# Patient Record
Sex: Female | Born: 1985 | Race: White | Hispanic: No | Marital: Married | Smoking: Never smoker
Health system: Southern US, Community
[De-identification: ages and names within clinical notes are randomized; demographics above are authoritative.]

## PROBLEM LIST (undated history)

## (undated) DIAGNOSIS — K589 Irritable bowel syndrome without diarrhea: Secondary | ICD-10-CM

## (undated) DIAGNOSIS — R011 Cardiac murmur, unspecified: Secondary | ICD-10-CM

## (undated) HISTORY — DX: Irritable bowel syndrome, unspecified: K58.9

## (undated) HISTORY — DX: Cardiac murmur, unspecified: R01.1

## (undated) HISTORY — PX: WISDOM TOOTH EXTRACTION: SHX21

---

## 2011-10-29 ENCOUNTER — Emergency Department
Admission: EM | Admit: 2011-10-29 | Discharge: 2011-10-29 | Disposition: A | Discharge status: Home / Self Care | Payer: BC Managed Care – PPO | Source: Home / Self Care

## 2011-10-29 ENCOUNTER — Encounter: Payer: Self-pay | Admitting: *Deleted

## 2011-10-29 DIAGNOSIS — R300 Dysuria: Secondary | ICD-10-CM

## 2011-10-29 LAB — POCT URINALYSIS DIP (MANUAL ENTRY)
Ketones, POC UA: NEGATIVE
Protein Ur, POC: 30
Spec Grav, UA: 1.02 (ref 1.005–1.03)
Urobilinogen, UA: 0.2 (ref 0–1)
pH, UA: 7 (ref 5–8)

## 2011-10-29 MED ORDER — CEPHALEXIN 500 MG PO CAPS: 500.0000 mg | ORAL_CAPSULE | Freq: Three times a day (TID) | ORAL | Status: AC

## 2011-10-29 NOTE — ED Notes (Signed)
Patient c/o dysuria and "pink tinged" urine x this AM. No otc meds taken.

## 2011-10-29 NOTE — ED Provider Notes (Signed)
History     CSN: [id number]  Arrival date & time 10/29/11  1802   None     Chief Complaint  Patient presents with  . Dysuria   HPI DYSURIA Onset:  2 days  Description: dysuria, increased urinary frequency Modifying factors: none  Symptoms Urgency:  yes Frequency: yes  Hesitancy:  no Hematuria: mild  Flank Pain: no  Fever: no Nausea/Vomiting:  no Missed LMP: no STD exposure:  Discharge: no Irritants: no Rash: no  Red Flags   More than 3 UTI's last 12 months:  no PMH of  Diabetes or Immunosuppression:  no Renal Disease/Calculi: no Urinary Tract Abnormality:  no Instrumentation or Trauma: no   History reviewed. No pertinent past medical history.  History reviewed. No pertinent past surgical history.  Family History  Problem Relation Age of Onset  . Rheum arthritis Father   . Heart attack Father     History  Substance Use Topics  . Smoking status: Not on file  . Smokeless tobacco: Not on file  . Alcohol Use: Not on file    OB History    No data available      Review of Systems  All other systems reviewed and are negative.    Allergies  Review of patient's allergies indicates no known allergies.  Home Medications   Current Outpatient Rx  Name Route Sig Dispense Refill  . NORETHINDRON-ETHINYL ESTRAD-FE 1-20/1-30/1-35 MG-MCG PO TABS Oral Take 1 tablet by mouth daily.      BP 126/82  Pulse 95  Temp 98.9 F (37.2 C) (Oral)  Resp 14  Ht 5\' 4"  (1.626 m)  Wt 128 lb (58.06 kg)  BMI 21.97 kg/m2  SpO2 98%  LMP 10/21/2011  Physical Exam  Constitutional: She appears well-developed and well-nourished.  HENT:  Head: Normocephalic and atraumatic.  Eyes: Conjunctivae are normal. Pupils are equal, round, and reactive to light.  Neck: Normal range of motion. Neck supple.  Cardiovascular: Normal rate and regular rhythm.   Pulmonary/Chest: Effort normal and breath sounds normal.  Abdominal: Soft. Bowel sounds are normal. There is no tenderness.    Musculoskeletal: Normal range of motion.  Neurological: She is alert.  Skin: Skin is warm.    ED Course  Procedures (including critical care time)  Labs Reviewed - No data to display No results found.   1. Dysuria       MDM  Will treat with keflex  Urine culture   Discussed infectious and upper urinary tract red flags.  Follow up as needed.      The patient and/or caregiver has been counseled thoroughly with regard to treatment plan and/or medications prescribed including dosage, schedule, interactions, rationale for use, and possible side effects and they verbalize understanding. Diagnoses and expected course of recovery discussed and will return if not improved as expected or if the condition worsens. Patient and/or caregiver verbalized understanding.             Floydene Flock, MD 10/29/11 [phone]

## 2011-10-29 NOTE — ED Provider Notes (Signed)
Agree with exam, assessment, and plan.   Lattie Haw, MD 10/29/11 [phone]

## 2011-11-01 ENCOUNTER — Telehealth: Payer: Self-pay | Admitting: Family Medicine

## 2011-11-01 LAB — URINE CULTURE

## 2012-05-17 ENCOUNTER — Ambulatory Visit (INDEPENDENT_AMBULATORY_CARE_PROVIDER_SITE_OTHER): Payer: BC Managed Care – PPO | Admitting: Obstetrics & Gynecology

## 2012-05-17 ENCOUNTER — Encounter: Payer: Self-pay | Admitting: Obstetrics & Gynecology

## 2012-05-17 VITALS — BP 135/77 | HR 90 | Temp 98.5°F | Resp 16 | Ht 64.0 in | Wt 128.0 lb

## 2012-05-17 DIAGNOSIS — R51 Headache: Secondary | ICD-10-CM

## 2012-05-17 DIAGNOSIS — K5900 Constipation, unspecified: Secondary | ICD-10-CM

## 2012-05-17 DIAGNOSIS — Z01419 Encounter for gynecological examination (general) (routine) without abnormal findings: Secondary | ICD-10-CM

## 2012-05-18 DIAGNOSIS — K5900 Constipation, unspecified: Secondary | ICD-10-CM | POA: Insufficient documentation

## 2012-05-18 DIAGNOSIS — G8929 Other chronic pain: Secondary | ICD-10-CM | POA: Insufficient documentation

## 2012-05-18 NOTE — Progress Notes (Signed)
  Subjective:     Victoria Lin is a 27 y.o. female here for a routine exam.  Current complaints: mild constipation not responding to fiber gummies, desires preconceptual counseling  Personal health questionnaire reviewed: yes.   Gynecologic History Patient's last menstrual period was 05/08/2012. Contraception: condoms Last Pap: 2013. Results were: normal per pt Last mammogram: n/a. Results were: n/a  Obstetric History OB History   Grav Para Term Preterm Abortions TAB SAB Ect Mult Living   0 0 0 0 0 0 0 0 0 0        The following portions of the patient's history were reviewed and updated as appropriate: allergies, current medications, past family history, past medical history, past social history, past surgical history and problem list.  Review of Systems A comprehensive review of systems was negative.    Objective:   Filed Vitals:   05/17/12 1554  BP: 135/77  Pulse: 90  Temp: 98.5 F (36.9 C)  TempSrc: Oral  Resp: 16  Height: 5\' 4"  (1.626 m)  Weight: 128 lb (58.06 kg)     Vitals:  WNL General appearance: alert, cooperative and no distress Head: Normocephalic, without obvious abnormality, atraumatic Eyes: negative Throat: lips, mucosa, and tongue normal; teeth and gums normal Lungs: clear to auscultation bilaterally Breasts: normal appearance, no masses or tenderness, No nipple retraction or dimpling, No nipple discharge or bleeding Heart: regular rate and rhythm Abdomen: soft, non-tender; bowel sounds normal; no masses,  no organomegaly Pelvic: cervix normal in appearance, external genitalia normal, no adnexal masses or tenderness, no bladder tenderness, no cervical motion tenderness, perianal skin: no external genital warts noted, urethra without abnormality or discharge, uterus normal size, shape, and consistency and vagina normal without discharge Extremities: no edema, redness or tenderness in the calves or thighs Skin: no lesions or rash Lymph nodes: Axillary  adenopathy: none        Assessment:    Healthy female exam.    Plan:    Education reviewed: preconceptual counseling, . Follow up in: 1 year.  or sooner if becomes pregnant No pap today since low risk and had one last year. Miralax for constipation Offered CF testing Prental vitamins ordered Refer to [facility] for headaches.

## 2012-06-09 ENCOUNTER — Telehealth: Payer: Self-pay

## 2012-06-09 NOTE — Telephone Encounter (Signed)
L/M for patient to call office and schedule appt with [facility].

## 2013-01-13 ENCOUNTER — Ambulatory Visit (INDEPENDENT_AMBULATORY_CARE_PROVIDER_SITE_OTHER): Payer: Managed Care, Other (non HMO) | Admitting: Advanced Practice Midwife

## 2013-01-13 ENCOUNTER — Encounter: Payer: Self-pay | Admitting: Advanced Practice Midwife

## 2013-01-13 VITALS — BP 132/85 | Wt 129.0 lb

## 2013-01-13 DIAGNOSIS — Z3401 Encounter for supervision of normal first pregnancy, first trimester: Secondary | ICD-10-CM

## 2013-01-13 DIAGNOSIS — Z3400 Encounter for supervision of normal first pregnancy, unspecified trimester: Secondary | ICD-10-CM

## 2013-01-13 NOTE — Progress Notes (Signed)
   Subjective:    Victoria Lin is a G1P0000 [redacted]w[redacted]d being seen today for her first obstetrical visit.  Her obstetrical history is significant for none primigravida. Patient does intend to breast feed. Pregnancy history fully reviewed.  Patient reports no complaints.  Filed Vitals:   01/13/13 0907  BP: 132/85  Weight: 129 lb (58.514 kg)    HISTORY: OB History  Gravida Para Term Preterm AB SAB TAB Ectopic Multiple Living  1 0 0 0 0 0 0 0 0 0     # Outcome Date GA Lbr Len/2nd Weight Sex Delivery Anes PTL Lv  1 CUR              Past Medical History  Diagnosis Date  . IBS (irritable bowel syndrome)   . Heart murmur     H/O as a child   Past Surgical History  Procedure Laterality Date  . Wisdom tooth extraction     Family History  Problem Relation Age of Onset  . Rheum arthritis Father   . Heart attack Father   . Lupus Father   . Cancer Father     skin     Exam    Uterus:     Pelvic Exam:    Perineum: No Hemorrhoids, Normal Perineum   Vulva: normal   Vagina:  Not evaluated   pH:    Cervix: no cervical motion tenderness   Adnexa: normal adnexa and no mass, fullness, tenderness   Bony Pelvis: average  System: Breast:  normal appearance, no masses or tenderness   Skin: normal coloration and turgor, no rashes    Neurologic: oriented, normal, gait normal; reflexes normal and symmetric   Extremities: normal strength, tone, and muscle mass, ROM of all joints is normal   HEENT neck supple with midline trachea and thyroid without masses   Mouth/Teeth mucous membranes moist, pharynx normal without lesions   Neck supple and no masses   Cardiovascular: regular rate and rhythm   Respiratory:  appears well, vitals normal, no respiratory distress, acyanotic, normal RR, ear and throat exam is normal, neck free of mass or lymphadenopathy, chest clear, no wheezing, crepitations, rhonchi, normal symmetric air entry   Abdomen: soft, non-tender; bowel sounds normal; no masses,  no  organomegaly   Urinary: urethral meatus normal      Assessment:    Pregnancy: G1P0000 Patient Active Problem List   Diagnosis Date Noted  . Constipation 05/18/2012  . Chronic headaches 05/18/2012        Plan:     Initial labs drawn. Prenatal vitamins. Problem list reviewed and updated. Genetic Screening discussed First Screen and Integrated Screen: undecided.  Ultrasound discussed; fetal survey: requested.  Follow up in 4 weeks. 50% of 30 min visit spent on counseling and coordination of care.     LEFTWICH-KIRBY, Sameka Bagent 01/13/2013

## 2013-01-13 NOTE — Progress Notes (Signed)
p-93  Pt here for initial prenatal visit with her husband by her side.  Planned pregnancy.  Bedside U/S showed IUP with FHT  CRL 23.59mm and GA of 9w

## 2013-01-14 LAB — OBSTETRIC PANEL
Eosinophils Relative: 0 % (ref 0–5)
Hemoglobin: 13.9 g/dL (ref 12.0–15.0)
Hepatitis B Surface Ag: NEGATIVE
Lymphocytes Relative: 22 % (ref 12–46)
Lymphs Abs: 2 10*3/uL (ref 0.7–4.0)
MCH: 30.1 pg (ref 26.0–34.0)
MCV: 89 fL (ref 78.0–100.0)
Monocytes Relative: 8 % (ref 3–12)
Platelets: 253 10*3/uL (ref 150–400)
RBC: 4.62 MIL/uL (ref 3.87–5.11)
Rh Type: POSITIVE
Rubella: 9.4 Index — ABNORMAL HIGH (ref <0.90)
WBC: 9.4 10*3/uL (ref 4.0–10.5)

## 2013-01-14 LAB — GC/CHLAMYDIA PROBE AMP, URINE: GC Probe Amp, Urine: NEGATIVE

## 2013-01-14 LAB — HIV ANTIBODY (ROUTINE TESTING W REFLEX): HIV: NONREACTIVE

## 2013-02-13 ENCOUNTER — Ambulatory Visit (INDEPENDENT_AMBULATORY_CARE_PROVIDER_SITE_OTHER): Payer: Managed Care, Other (non HMO) | Admitting: Advanced Practice Midwife

## 2013-02-13 VITALS — BP 147/82 | Wt 133.0 lb

## 2013-02-13 DIAGNOSIS — Z3480 Encounter for supervision of other normal pregnancy, unspecified trimester: Secondary | ICD-10-CM

## 2013-02-13 DIAGNOSIS — Z23 Encounter for immunization: Secondary | ICD-10-CM

## 2013-02-13 NOTE — Patient Instructions (Signed)
Your blood type is O positive  Second Trimester of Pregnancy The second trimester is from week 13 through week 28, months 4 through 6. The second trimester is often a time when you feel your best. Your body has also adjusted to being pregnant, and you begin to feel better physically. Usually, morning sickness has lessened or quit completely, you may have more energy, and you may have an increase in appetite. The second trimester is also a time when the fetus is growing rapidly. At the end of the sixth month, the fetus is about 9 inches long and weighs about 1 pounds. You will likely begin to feel the baby move (quickening) between 18 and 20 weeks of the pregnancy. BODY CHANGES Your body goes through many changes during pregnancy. The changes vary from woman to woman.   Your weight will continue to increase. You will notice your lower abdomen bulging out.  You may begin to get stretch marks on your hips, abdomen, and breasts.  You may develop headaches that can be relieved by medicines approved by your caregiver.  You may urinate more often because the fetus is pressing on your bladder.  You may develop or continue to have heartburn as a result of your pregnancy.  You may develop constipation because certain hormones are causing the muscles that push waste through your intestines to slow down.  You may develop hemorrhoids or swollen, bulging veins (varicose veins).  You may have back pain because of the weight gain and pregnancy hormones relaxing your joints between the bones in your pelvis and as a result of a shift in weight and the muscles that support your balance.  Your breasts will continue to grow and be tender.  Your gums may bleed and may be sensitive to brushing and flossing.  Dark spots or blotches (chloasma, mask of pregnancy) may develop on your face. This will likely fade after the baby is born.  A dark line from your belly button to the pubic area (linea nigra) may appear.  This will likely fade after the baby is born. WHAT TO EXPECT AT YOUR PRENATAL VISITS During a routine prenatal visit:  You will be weighed to make sure you and the fetus are growing normally.  Your blood pressure will be taken.  Your abdomen will be measured to track your baby's growth.  The fetal heartbeat will be listened to.  Any test results from the previous visit will be discussed. Your caregiver may ask you:  How you are feeling.  If you are feeling the baby move.  If you have had any abnormal symptoms, such as leaking fluid, bleeding, severe headaches, or abdominal cramping.  If you have any questions. Other tests that may be performed during your second trimester include:  Blood tests that check for:  Low iron levels (anemia).  Gestational diabetes (between 24 and 28 weeks).  Rh antibodies.  Urine tests to check for infections, diabetes, or protein in the urine.  An ultrasound to confirm the proper growth and development of the baby.  An amniocentesis to check for possible genetic problems.  Fetal screens for spina bifida and Down syndrome. HOME CARE INSTRUCTIONS   Avoid all smoking, herbs, alcohol, and unprescribed drugs. These chemicals affect the formation and growth of the baby.  Follow your caregiver's instructions regarding medicine use. There are medicines that are either safe or unsafe to take during pregnancy.  Exercise only as directed by your caregiver. Experiencing uterine cramps is a good sign to  stop exercising.  Continue to eat regular, healthy meals.  Wear a good support bra for breast tenderness.  Do not use hot tubs, steam rooms, or saunas.  Wear your seat belt at all times when driving.  Avoid raw meat, uncooked cheese, cat litter boxes, and soil used by cats. These carry germs that can cause birth defects in the baby.  Take your prenatal vitamins.  Try taking a stool softener (if your caregiver approves) if you develop  constipation. Eat more high-fiber foods, such as fresh vegetables or fruit and whole grains. Drink plenty of fluids to keep your urine clear or pale yellow.  Take warm sitz baths to soothe any pain or discomfort caused by hemorrhoids. Use hemorrhoid cream if your caregiver approves.  If you develop varicose veins, wear support hose. Elevate your feet for 15 minutes, 3 4 times a day. Limit salt in your diet.  Avoid heavy lifting, wear low heel shoes, and practice good posture.  Rest with your legs elevated if you have leg cramps or low back pain.  Visit your dentist if you have not gone yet during your pregnancy. Use a soft toothbrush to brush your teeth and be gentle when you floss.  A sexual relationship may be continued unless your caregiver directs you otherwise.  Continue to go to all your prenatal visits as directed by your caregiver. SEEK MEDICAL CARE IF:   You have dizziness.  You have mild pelvic cramps, pelvic pressure, or nagging pain in the abdominal area.  You have persistent nausea, vomiting, or diarrhea.  You have a bad smelling vaginal discharge.  You have pain with urination. SEEK IMMEDIATE MEDICAL CARE IF:   You have a fever.  You are leaking fluid from your vagina.  You have spotting or bleeding from your vagina.  You have severe abdominal cramping or pain.  You have rapid weight gain or loss.  You have shortness of breath with chest pain.  You notice sudden or extreme swelling of your face, hands, ankles, feet, or legs.  You have not felt your baby move in over an hour.  You have severe headaches that do not go away with medicine.  You have vision changes. Document Released: 02/24/2001 Document Revised: 11/02/2012 Document Reviewed: 05/03/2012 [facility] Patient Information 2014 [place], [place].  Breastfeeding Deciding to breastfeed is one of the best choices you can make for you and your baby. A change in hormones during pregnancy causes your  breast tissue to grow and increases the number and size of your milk ducts. These hormones also allow proteins, sugars, and fats from your blood supply to make breast milk in your milk-producing glands. Hormones prevent breast milk from being released before your baby is born as well as prompt milk flow after birth. Once breastfeeding has begun, thoughts of your baby, as well as his or her sucking or crying, can stimulate the release of milk from your milk-producing glands.  BENEFITS OF BREASTFEEDING For Your Baby  Your first milk (colostrum) helps your baby's digestive system function better.   There are antibodies in your milk that help your baby fight off infections.   Your baby has a lower incidence of asthma, allergies, and sudden infant death syndrome.   The nutrients in breast milk are better for your baby than infant formulas and are designed uniquely for your baby's needs.   Breast milk improves your baby's brain development.   Your baby is less likely to develop other conditions, such as childhood obesity, asthma,  or type 2 diabetes mellitus.  For You   Breastfeeding helps to create a very special bond between you and your baby.   Breastfeeding is convenient. Breast milk is always available at the correct temperature and costs nothing.   Breastfeeding helps to burn calories and helps you lose the weight gained during pregnancy.   Breastfeeding makes your uterus contract to its prepregnancy size faster and slows bleeding (lochia) after you give birth.   Breastfeeding helps to lower your risk of developing type 2 diabetes mellitus, osteoporosis, and breast or ovarian cancer later in life. SIGNS THAT YOUR BABY IS HUNGRY Early Signs of Hunger  Increased alertness or activity.  Stretching.  Movement of the head from side to side.  Movement of the head and opening of the mouth when the corner of the mouth or cheek is stroked (rooting).  Increased sucking sounds,  smacking lips, cooing, sighing, or squeaking.  Hand-to-mouth movements.  Increased sucking of fingers or hands. Late Signs of Hunger  Fussing.  Intermittent crying. Extreme Signs of Hunger Signs of extreme hunger will require calming and consoling before your baby will be able to breastfeed successfully. Do not wait for the following signs of extreme hunger to occur before you initiate breastfeeding:   Restlessness.  A loud, strong cry.   Screaming. BREASTFEEDING BASICS Breastfeeding Initiation  Find a comfortable place to sit or lie down, with your neck and back well supported.  Place a pillow or rolled up blanket under your baby to bring him or her to the level of your breast (if you are seated). Nursing pillows are specially designed to help support your arms and your baby while you breastfeed.  Make sure that your baby's abdomen is facing your abdomen.   Gently massage your breast. With your fingertips, massage from your chest wall toward your nipple in a circular motion. This encourages milk flow. You may need to continue this action during the feeding if your milk flows slowly.  Support your breast with 4 fingers underneath and your thumb above your nipple. Make sure your fingers are well away from your nipple and your baby's mouth.   Stroke your baby's lips gently with your finger or nipple.   When your baby's mouth is open wide enough, quickly bring your baby to your breast, placing your entire nipple and as much of the colored area around your nipple (areola) as possible into your baby's mouth.   More areola should be visible above your baby's upper lip than below the lower lip.   Your baby's tongue should be between his or her lower gum and your breast.   Ensure that your baby's mouth is correctly positioned around your nipple (latched). Your baby's lips should create a seal on your breast and be turned out (everted).  It is common for your baby to suck about  2 3 minutes in order to start the flow of breast milk. Latching Teaching your baby how to latch on to your breast properly is very important. An improper latch can cause nipple pain and decreased milk supply for you and poor weight gain in your baby. Also, if your baby is not latched onto your nipple properly, he or she may swallow some air during feeding. This can make your baby fussy. Burping your baby when you switch breasts during the feeding can help to get rid of the air. However, teaching your baby to latch on properly is still the best way to prevent fussiness from swallowing air  while breastfeeding. Signs that your baby has successfully latched on to your nipple:    Silent tugging or silent sucking, without causing you pain.   Swallowing heard between every 3 4 sucks.    Muscle movement above and in front of his or her ears while sucking.  Signs that your baby has not successfully latched on to nipple:   Sucking sounds or smacking sounds from your baby while breastfeeding.  Nipple pain. If you think your baby has not latched on correctly, slip your finger into the corner of your baby's mouth to break the suction and place it between your baby's gums. Attempt breastfeeding initiation again. Signs of Successful Breastfeeding Signs from your baby:   A gradual decrease in the number of sucks or complete cessation of sucking.   Falling asleep.   Relaxation of his or her body.   Retention of a small amount of milk in his or her mouth.   Letting go of your breast by himself or herself. Signs from you:  Breasts that have increased in firmness, weight, and size 1 3 hours after feeding.   Breasts that are softer immediately after breastfeeding.  Increased milk volume, as well as a change in milk consistency and color by the 5th day of breastfeeding.   Nipples that are not sore, cracked, or bleeding. Signs That Your Pecola Leisure is Getting Enough Milk  Wetting at least 3  diapers in a 24-hour period. The urine should be clear and pale yellow by age 775 days.  At least 3 stools in a 24-hour period by age 775 days. The stool should be soft and yellow.  At least 3 stools in a 24-hour period by age 100 days. The stool should be seedy and yellow.  No loss of weight greater than 10% of birth weight during the first 66 days of age.  Average weight gain of 4 7 ounces (120 210 mL) per week after age 77 days.  Consistent daily weight gain by age 775 days, without weight loss after the age of 2 weeks. After a feeding, your baby may spit up a small amount. This is common. BREASTFEEDING FREQUENCY AND DURATION Frequent feeding will help you make more milk and can prevent sore nipples and breast engorgement. Breastfeed when you feel the need to reduce the fullness of your breasts or when your baby shows signs of hunger. This is called "breastfeeding on demand." Avoid introducing a pacifier to your baby while you are working to establish breastfeeding (the first 4 6 weeks after your baby is born). After this time you may choose to use a pacifier. Research has shown that pacifier use during the first year of a baby's life decreases the risk of sudden infant death syndrome (SIDS). Allow your baby to feed on each breast as long as he or she wants. Breastfeed until your baby is finished feeding. When your baby unlatches or falls asleep while feeding from the first breast, offer the second breast. Because newborns are often sleepy in the first few weeks of life, you may need to awaken your baby to get him or her to feed. Breastfeeding times will vary from baby to baby. However, the following rules can serve as a guide to help you ensure that your baby is properly fed:  Newborns (babies 8 weeks of age or younger) may breastfeed every 1 3 hours.  Newborns should not go longer than 3 hours during the day or 5 hours during the night without breastfeeding.  You  should breastfeed your baby a minimum  of 8 times in a 24-hour period until you begin to introduce solid foods to your baby at around 28 months of age. BREAST MILK PUMPING Pumping and storing breast milk allows you to ensure that your baby is exclusively fed your breast milk, even at times when you are unable to breastfeed. This is especially important if you are going back to work while you are still breastfeeding or when you are not able to be present during feedings. Your lactation consultant can give you guidelines on how long it is safe to store breast milk.  A breast pump is a machine that allows you to pump milk from your breast into a sterile bottle. The pumped breast milk can then be stored in a refrigerator or freezer. Some breast pumps are operated by hand, while others use electricity. Ask your lactation consultant which type will work best for you. Breast pumps can be purchased, but some hospitals and breastfeeding support groups lease breast pumps on a monthly basis. A lactation consultant can teach you how to hand express breast milk, if you prefer not to use a pump.  CARING FOR YOUR BREASTS WHILE YOU BREASTFEED Nipples can become dry, cracked, and sore while breastfeeding. The following recommendations can help keep your breasts moisturized and healthy:  Avoid using soap on your nipples.   Wear a supportive bra. Although not required, special nursing bras and tank tops are designed to allow access to your breasts for breastfeeding without taking off your entire bra or top. Avoid wearing underwire style bras or extremely tight bras.  Air dry your nipples for 3 after each feeding.   Use only cotton bra pads to absorb leaked breast milk. Leaking of breast milk between feedings is normal.   Use lanolin on your nipples after breastfeeding. Lanolin helps to maintain your skin's normal moisture barrier. If you use pure lanolin you do not need to wash it off before feeding your baby again. Pure lanolin is not toxic to  your baby. You may also hand express a few drops of breast milk and gently massage that milk into your nipples and allow the milk to air dry. In the first few weeks after giving birth, some women experience extremely full breasts (engorgement). Engorgement can make your breasts feel heavy, warm, and tender to the touch. Engorgement peaks within 3 5 days after you give birth. The following recommendations can help ease engorgement:  Completely empty your breasts while breastfeeding or pumping. You may want to start by applying warm, moist heat (in the shower or with warm water-soaked hand towels) just before feeding or pumping. This increases circulation and helps the milk flow. If your baby does not completely empty your breasts while breastfeeding, pump any extra milk after he or she is finished.  Wear a snug bra (nursing or regular) or tank top for 1 2 days to signal your body to slightly decrease milk production.  Apply ice packs to your breasts, unless this is too uncomfortable for you.  Make sure that your baby is latched on and positioned properly while breastfeeding. If engorgement persists after 48 hours of following these recommendations, contact your health care provider or a Advertising copywriter. OVERALL HEALTH CARE RECOMMENDATIONS WHILE BREASTFEEDING  Eat healthy foods. Alternate between meals and snacks, eating 3 of each per day. Because what you eat affects your breast milk, some of the foods may make your baby more irritable than usual. Avoid eating these foods  if you are sure that they are negatively affecting your baby.  Drink milk, fruit juice, and water to satisfy your thirst (about 10 glasses a day).   Rest often, relax, and continue to take your prenatal vitamins to prevent fatigue, stress, and anemia.  Continue breast self-awareness checks.  Avoid chewing and smoking tobacco.  Avoid alcohol and drug use. Some medicines that may be harmful to your baby can pass through  breast milk. It is important to ask your health care provider before taking any medicine, including all over-the-counter and prescription medicine as well as vitamin and herbal supplements. It is possible to become pregnant while breastfeeding. If birth control is desired, ask your health care provider about options that will be safe for your baby. SEEK MEDICAL CARE IF:   You feel like you want to stop breastfeeding or have become frustrated with breastfeeding.  You have painful breasts or nipples.  Your nipples are cracked or bleeding.  Your breasts are red, tender, or warm.  You have a swollen area on either breast.  You have a fever or chills.  You have nausea or vomiting.  You have drainage other than breast milk from your nipples.  Your breasts do not become full before feedings by the 5th day after you give birth.  You feel sad and depressed.  Your baby is too sleepy to eat well.  Your baby is having trouble sleeping.   Your baby is wetting less than 3 diapers in a 24-hour period.  Your baby has less than 3 stools in a 24-hour period.  Your baby's skin or the white part of his or her eyes becomes yellow.   Your baby is not gaining weight by 34 days of age. SEEK IMMEDIATE MEDICAL CARE IF:   Your baby is overly tired (lethargic) and does not want to wake up and feed.  Your baby develops an unexplained fever. Document Released: 03/02/2005 Document Revised: 11/02/2012 Document Reviewed: 08/24/2012 [facility] Patient Information 2014 [place], [place].

## 2013-02-13 NOTE — Progress Notes (Signed)
P - [phone] 

## 2013-02-19 NOTE — Progress Notes (Signed)
Watch BP. Declined first trimester screen. Flu vaccine.

## 2013-03-13 ENCOUNTER — Ambulatory Visit (INDEPENDENT_AMBULATORY_CARE_PROVIDER_SITE_OTHER): Payer: Managed Care, Other (non HMO) | Admitting: Advanced Practice Midwife

## 2013-03-13 VITALS — BP 140/81 | Wt 138.0 lb

## 2013-03-13 DIAGNOSIS — Z3400 Encounter for supervision of normal first pregnancy, unspecified trimester: Secondary | ICD-10-CM

## 2013-03-13 DIAGNOSIS — Z3402 Encounter for supervision of normal first pregnancy, second trimester: Secondary | ICD-10-CM

## 2013-03-13 MED ORDER — COMPLETE PRENATAL/DHA 30-0.975 & 300 MG PO MISC: 1.0000 | Freq: Every day | ORAL | Status: DC

## 2013-03-13 NOTE — Progress Notes (Signed)
Doing well.  Denies vaginal bleeding, LOF, regular contractions.  Denies h/a, epigastric pain, visual disturbances.  Anatomy U/S scheduled.  Initial BP elevated at visit with normal retake.  Will continue to monitor.

## 2013-03-13 NOTE — Progress Notes (Signed)
p-97   Pt states she had one episode of syncope on Christmas Eve.  Hot, sweaty and faint.  Felt better after eating.

## 2013-03-13 NOTE — Progress Notes (Signed)
BP recheck 133/82

## 2013-03-31 ENCOUNTER — Ambulatory Visit (HOSPITAL_COMMUNITY)
Admission: RE | Admit: 2013-03-31 | Discharge: 2013-03-31 | Disposition: A | Discharge status: Home / Self Care | Payer: BC Managed Care – PPO | Source: Ambulatory Visit | Attending: Advanced Practice Midwife | Admitting: Advanced Practice Midwife

## 2013-03-31 DIAGNOSIS — Z3689 Encounter for other specified antenatal screening: Secondary | ICD-10-CM | POA: Insufficient documentation

## 2013-03-31 DIAGNOSIS — Z3402 Encounter for supervision of normal first pregnancy, second trimester: Secondary | ICD-10-CM

## 2013-03-31 DIAGNOSIS — Z3400 Encounter for supervision of normal first pregnancy, unspecified trimester: Secondary | ICD-10-CM

## 2013-04-10 ENCOUNTER — Encounter: Payer: Self-pay | Admitting: Advanced Practice Midwife

## 2013-04-10 ENCOUNTER — Ambulatory Visit (INDEPENDENT_AMBULATORY_CARE_PROVIDER_SITE_OTHER): Payer: BC Managed Care – PPO | Admitting: Advanced Practice Midwife

## 2013-04-10 VITALS — BP 142/82 | Wt 143.0 lb

## 2013-04-10 DIAGNOSIS — I10 Essential (primary) hypertension: Secondary | ICD-10-CM

## 2013-04-10 DIAGNOSIS — Z3400 Encounter for supervision of normal first pregnancy, unspecified trimester: Secondary | ICD-10-CM

## 2013-04-10 DIAGNOSIS — R0989 Other specified symptoms and signs involving the circulatory and respiratory systems: Secondary | ICD-10-CM

## 2013-04-10 NOTE — Progress Notes (Signed)
p-94  Pt is having some Rt sciatica pain  BP recheck 123/81

## 2013-04-10 NOTE — Addendum Note (Signed)
Addended by: Granville LewisLARK, Andraya Frigon L on: 04/10/2013 09:37 AM   Modules accepted: Orders

## 2013-04-10 NOTE — Patient Instructions (Signed)
Second Trimester of Pregnancy The second trimester is from week 13 through week 28, months 4 through 6. The second trimester is often a time when you feel your best. Your body has also adjusted to being pregnant, and you begin to feel better physically. Usually, morning sickness has lessened or quit completely, you may have more energy, and you may have an increase in appetite. The second trimester is also a time when the fetus is growing rapidly. At the end of the sixth month, the fetus is about 9 inches long and weighs about 1 pounds. You will likely begin to feel the baby move (quickening) between 18 and 20 weeks of the pregnancy. BODY CHANGES Your body goes through many changes during pregnancy. The changes vary from woman to woman.   Your weight will continue to increase. You will notice your lower abdomen bulging out.  You may begin to get stretch marks on your hips, abdomen, and breasts.  You may develop headaches that can be relieved by medicines approved by your caregiver.  You may urinate more often because the fetus is pressing on your bladder.  You may develop or continue to have heartburn as a result of your pregnancy.  You may develop constipation because certain hormones are causing the muscles that push waste through your intestines to slow down.  You may develop hemorrhoids or swollen, bulging veins (varicose veins).  You may have back pain because of the weight gain and pregnancy hormones relaxing your joints between the bones in your pelvis and as a result of a shift in weight and the muscles that support your balance.  Your breasts will continue to grow and be tender.  Your gums may bleed and may be sensitive to brushing and flossing.  Dark spots or blotches (chloasma, mask of pregnancy) may develop on your face. This will likely fade after the baby is born.  A dark line from your belly button to the pubic area (linea nigra) may appear. This will likely fade after the  baby is born. WHAT TO EXPECT AT YOUR PRENATAL VISITS During a routine prenatal visit:  You will be weighed to make sure you and the fetus are growing normally.  Your blood pressure will be taken.  Your abdomen will be measured to track your baby's growth.  The fetal heartbeat will be listened to.  Any test results from the previous visit will be discussed. Your caregiver may ask you:  How you are feeling.  If you are feeling the baby move.  If you have had any abnormal symptoms, such as leaking fluid, bleeding, severe headaches, or abdominal cramping.  If you have any questions. Other tests that may be performed during your second trimester include:  Blood tests that check for:  Low iron levels (anemia).  Gestational diabetes (between 24 and 28 weeks).  Rh antibodies.  Urine tests to check for infections, diabetes, or protein in the urine.  An ultrasound to confirm the proper growth and development of the baby.  An amniocentesis to check for possible genetic problems.  Fetal screens for spina bifida and Down syndrome. HOME CARE INSTRUCTIONS   Avoid all smoking, herbs, alcohol, and unprescribed drugs. These chemicals affect the formation and growth of the baby.  Follow your caregiver's instructions regarding medicine use. There are medicines that are either safe or unsafe to take during pregnancy.  Exercise only as directed by your caregiver. Experiencing uterine cramps is a good sign to stop exercising.  Continue to eat regular,   healthy meals.  Wear a good support bra for breast tenderness.  Do not use hot tubs, steam rooms, or saunas.  Wear your seat belt at all times when driving.  Avoid raw meat, uncooked cheese, cat litter boxes, and soil used by cats. These carry germs that can cause birth defects in the baby.  Take your prenatal vitamins.  Try taking a stool softener (if your caregiver approves) if you develop constipation. Eat more high-fiber foods,  such as fresh vegetables or fruit and whole grains. Drink plenty of fluids to keep your urine clear or pale yellow.  Take warm sitz baths to soothe any pain or discomfort caused by hemorrhoids. Use hemorrhoid cream if your caregiver approves.  If you develop varicose veins, wear support hose. Elevate your feet for 15 minutes, 3 4 times a day. Limit salt in your diet.  Avoid heavy lifting, wear low heel shoes, and practice good posture.  Rest with your legs elevated if you have leg cramps or low back pain.  Visit your dentist if you have not gone yet during your pregnancy. Use a soft toothbrush to brush your teeth and be gentle when you floss.  A sexual relationship may be continued unless your caregiver directs you otherwise.  Continue to go to all your prenatal visits as directed by your caregiver. SEEK MEDICAL CARE IF:   You have dizziness.  You have mild pelvic cramps, pelvic pressure, or nagging pain in the abdominal area.  You have persistent nausea, vomiting, or diarrhea.  You have a bad smelling vaginal discharge.  You have pain with urination. SEEK IMMEDIATE MEDICAL CARE IF:   You have a fever.  You are leaking fluid from your vagina.  You have spotting or bleeding from your vagina.  You have severe abdominal cramping or pain.  You have rapid weight gain or loss.  You have shortness of breath with chest pain.  You notice sudden or extreme swelling of your face, hands, ankles, feet, or legs.  You have not felt your baby move in over an hour.  You have severe headaches that do not go away with medicine.  You have vision changes. Document Released: 02/24/2001 Document Revised: 11/02/2012 Document Reviewed: 05/03/2012 [employer] Patient Information 2014 [employer].  

## 2013-04-10 NOTE — Progress Notes (Signed)
BP borderline today. Has been that way since new OB.  ?chronic.  Will check Pr/Cr Ratio today as baseline. May need CMET with glucola.  Repeat BP 123/81

## 2013-04-11 LAB — PROTEIN / CREATININE RATIO, URINE
Creatinine, Urine: 93.3 mg/dL
Protein Creatinine Ratio: 0.1 (ref <0.15)
TOTAL PROTEIN, URINE: 9 mg/dL

## 2013-04-17 ENCOUNTER — Encounter: Payer: Self-pay | Admitting: Advanced Practice Midwife

## 2013-05-08 ENCOUNTER — Encounter: Payer: Self-pay | Admitting: Advanced Practice Midwife

## 2013-05-08 ENCOUNTER — Ambulatory Visit (INDEPENDENT_AMBULATORY_CARE_PROVIDER_SITE_OTHER): Payer: BC Managed Care – PPO | Admitting: Advanced Practice Midwife

## 2013-05-08 VITALS — BP 140/72 | Wt 148.0 lb

## 2013-05-08 DIAGNOSIS — Z3400 Encounter for supervision of normal first pregnancy, unspecified trimester: Secondary | ICD-10-CM

## 2013-05-08 DIAGNOSIS — O169 Unspecified maternal hypertension, unspecified trimester: Secondary | ICD-10-CM | POA: Insufficient documentation

## 2013-05-08 DIAGNOSIS — IMO0002 Reserved for concepts with insufficient information to code with codable children: Secondary | ICD-10-CM

## 2013-05-08 NOTE — Progress Notes (Signed)
p-88 

## 2013-05-08 NOTE — Patient Instructions (Signed)
Hypertension During Pregnancy Hypertension is also called high blood pressure. It can occur at any time in life and during pregnancy. When you have hypertension, there is extra pressure inside your blood vessels that carry blood from the heart to the rest of your body (arteries). Hypertension during pregnancy can cause problems for you and your baby. Your baby might not weigh as much as it should at birth or might be born early (premature). Very bad cases of hypertension during pregnancy can be life threatening.  Different types of hypertension can occur during pregnancy.   Chronic hypertension. This happens when a woman has hypertension before pregnancy and it continues during pregnancy.  Gestational hypertension. This is when hypertension develops during pregnancy.  Preeclampsia or toxemia of pregnancy. This is a very serious type of hypertension that develops only during pregnancy. It is a disease that affects the whole body (systemic) and can be very dangerous for both mother and baby.  Gestational hypertension and preeclampsia usually go away after your baby is born. Blood pressure generally stabilizes within 6 weeks. Women who have hypertension during pregnancy have a greater chance of developing hypertension later in life or with future pregnancies. RISK FACTORS Some factors make you more likely to develop hypertension during pregnancy. Risk factors include:  Having hypertension before pregnancy.  Having hypertension during a previous pregnancy.  Being overweight.  Being older than 40.  Being pregnant with more than one baby (multiples).  Having diabetes or kidney problems. SIGNS AND SYMPTOMS Chronic and gestational hypertension rarely cause symptoms. Preeclampsia has symptoms, which may include:  Increased protein in your urine. Your health care provider will check for this at every prenatal visit.  Swelling of your hands and face.  Rapid weight gain.  Headaches.  Visual  changes.  Being bothered by light.  Abdominal pain, especially in the right upper area.  Chest pain.  Shortness of breath.  Increased reflexes.  Seizures. Seizures occur with a more severe form of preeclampsia, called eclampsia. DIAGNOSIS   You may be diagnosed with hypertension during a regular prenatal exam. At each visit, tests may include:  Blood pressure checks.  A urine test to check for protein in your urine.  The type of hypertension you are diagnosed with depends on when you developed it. It also depends on your specific blood pressure reading.  Developing hypertension before 20 weeks of pregnancy is consistent with chronic hypertension.  Developing hypertension after 20 weeks of pregnancy is consistent with gestational hypertension.  Hypertension with increased urinary protein is diagnosed as preeclampsia.  Blood pressure measurements that stay above 160 systolic or 110 diastolic are a sign of severe preeclampsia. TREATMENT Treatment for hypertension during pregnancy varies. Treatment depends on the type of hypertension and how serious it is.  If you take medicine for chronic hypertension, you may need to switch medicines.  Drugs called ACE inhibitors should not be taken during pregnancy.  Low-dose aspirin may be suggested for women who have risk factors for preeclampsia.  If you have gestational hypertension, you may need to take a blood pressure medicine that is safe during pregnancy. Your health care provider will recommend the appropriate medicine.  If you have severe preeclampsia, you may need to be in the hospital. Health care providers will watch you and the baby very closely. You also may need to take medicine (magnesium sulfate) to prevent seizures and lower blood pressure.  Sometimes an early delivery is needed. This may be the case if the condition worsens. It would   be done to protect you and the baby. The only cure for preeclampsia is delivery. HOME  CARE INSTRUCTIONS  Schedule and keep all of your regular appointments for prenatal care.  Only take over-the-counter or prescription medicines as directed by your health care provider. Tell your health care provider about all medicines you take.  Eat as little salt as possible.  Get regular exercise.  Do not drink alcohol.  Do not use tobacco products.  Do not drink products with caffeine.  Lie on your left side when resting. SEEK IMMEDIATE MEDICAL CARE IF:  You have severe abdominal pain.  You have sudden swelling in the hands, ankles, or face.  You gain 4 pounds (1.8 kg) or more in 1 week.  You vomit repeatedly.  You have vaginal bleeding.  You do not feel the baby moving as much.  You have a headache.  You have blurred or double vision.  You have muscle twitching or spasms.  You have shortness of breath.  You have blue fingernails and lips.  You have blood in your urine. MAKE SURE YOU:  Understand these instructions.  Will watch your condition.  Will get help right away if you are not doing well or get worse. Document Released: 11/18/2010 Document Revised: 12/21/2012 Document Reviewed: 09/29/2012 [facility] Patient Information 2014 [place], [place].  Upper Respiratory Infection, Adult An upper respiratory infection (URI) is also sometimes known as the common cold. The upper respiratory tract includes the nose, sinuses, throat, trachea, and bronchi. Bronchi are the airways leading to the lungs. Most people improve within 1 week, but symptoms can last up to 2 weeks. A residual cough may last even longer.  CAUSES Many different viruses can infect the tissues lining the upper respiratory tract. The tissues become irritated and inflamed and often become very moist. Mucus production is also common. A cold is contagious. You can easily spread the virus to others by oral contact. This includes kissing, sharing a glass, coughing, or sneezing. Touching your mouth or nose  and then touching a surface, which is then touched by another person, can also spread the virus. SYMPTOMS  Symptoms typically develop 1 to 3 days after you come in contact with a cold virus. Symptoms vary from person to person. They may include:  Runny nose.  Sneezing.  Nasal congestion.  Sinus irritation.  Sore throat.  Loss of voice (laryngitis).  Cough.  Fatigue.  Muscle aches.  Loss of appetite.  Headache.  Low-grade fever. DIAGNOSIS  You might diagnose your own cold based on familiar symptoms, since most people get a cold 2 to 3 times a year. Your caregiver can confirm this based on your exam. Most importantly, your caregiver can check that your symptoms are not due to another disease such as strep throat, sinusitis, pneumonia, asthma, or epiglottitis. Blood tests, throat tests, and X-rays are not necessary to diagnose a common cold, but they may sometimes be helpful in excluding other more serious diseases. Your caregiver will decide if any further tests are required. RISKS AND COMPLICATIONS  You may be at risk for a more severe case of the common cold if you smoke cigarettes, have chronic heart disease (such as heart failure) or lung disease (such as asthma), or if you have a weakened immune system. The very young and very old are also at risk for more serious infections. Bacterial sinusitis, middle ear infections, and bacterial pneumonia can complicate the common cold. The common cold can worsen asthma and chronic obstructive pulmonary disease (COPD).  Sometimes, these complications can require emergency medical care and may be life-threatening. PREVENTION  The best way to protect against getting a cold is to practice good hygiene. Avoid oral or hand contact with people with cold symptoms. Wash your hands often if contact occurs. There is no clear evidence that vitamin C, vitamin E, echinacea, or exercise reduces the chance of developing a cold. However, it is always  recommended to get plenty of rest and practice good nutrition. TREATMENT  Treatment is directed at relieving symptoms. There is no cure. Antibiotics are not effective, because the infection is caused by a virus, not by bacteria. Treatment may include:  Increased fluid intake. Sports drinks offer valuable electrolytes, sugars, and fluids.  Breathing heated mist or steam (vaporizer or shower).  Eating chicken soup or other clear broths, and maintaining good nutrition.  Getting plenty of rest.  Using gargles or lozenges for comfort.  Controlling fevers with ibuprofen or acetaminophen as directed by your caregiver.  Increasing usage of your inhaler if you have asthma. Zinc gel and zinc lozenges, taken in the first 24 hours of the common cold, can shorten the duration and lessen the severity of symptoms. Pain medicines may help with fever, muscle aches, and throat pain. A variety of non-prescription medicines are available to treat congestion and runny nose. Your caregiver can make recommendations and may suggest nasal or lung inhalers for other symptoms.  HOME CARE INSTRUCTIONS   Only take over-the-counter or prescription medicines for pain, discomfort, or fever as directed by your caregiver.  Use a warm mist humidifier or inhale steam from a shower to increase air moisture. This may keep secretions moist and make it easier to breathe.  Drink enough water and fluids to keep your urine clear or pale yellow.  Rest as needed.  Return to work when your temperature has returned to normal or as your caregiver advises. You may need to stay home longer to avoid infecting others. You can also use a face mask and careful hand washing to prevent spread of the virus. SEEK MEDICAL CARE IF:   After the first few days, you feel you are getting worse rather than better.  You need your caregiver's advice about medicines to control symptoms.  You develop chills, worsening shortness of breath, or brown  or red sputum. These may be signs of pneumonia.  You develop yellow or brown nasal discharge or pain in the face, especially when you bend forward. These may be signs of sinusitis.  You develop a fever, swollen neck glands, pain with swallowing, or white areas in the back of your throat. These may be signs of strep throat. SEEK IMMEDIATE MEDICAL CARE IF:   You have a fever.  You develop severe or persistent headache, ear pain, sinus pain, or chest pain.  You develop wheezing, a prolonged cough, cough up blood, or have a change in your usual mucus (if you have chronic lung disease).  You develop sore muscles or a stiff neck. Document Released: 08/26/2000 Document Revised: 05/25/2011 Document Reviewed: 07/04/2010 [facility] Patient Information 2014 [place], [place].

## 2013-05-29 ENCOUNTER — Ambulatory Visit (HOSPITAL_COMMUNITY)
Admission: RE | Admit: 2013-05-29 | Discharge: 2013-05-29 | Disposition: A | Discharge status: Home / Self Care | Payer: BC Managed Care – PPO | Source: Ambulatory Visit | Attending: Advanced Practice Midwife | Admitting: Advanced Practice Midwife

## 2013-05-29 ENCOUNTER — Ambulatory Visit (INDEPENDENT_AMBULATORY_CARE_PROVIDER_SITE_OTHER): Payer: BC Managed Care – PPO | Admitting: Advanced Practice Midwife

## 2013-05-29 ENCOUNTER — Ambulatory Visit (HOSPITAL_COMMUNITY): Payer: BC Managed Care – PPO

## 2013-05-29 VITALS — BP 131/80 | Wt 153.0 lb

## 2013-05-29 DIAGNOSIS — O169 Unspecified maternal hypertension, unspecified trimester: Secondary | ICD-10-CM

## 2013-05-29 DIAGNOSIS — O139 Gestational [pregnancy-induced] hypertension without significant proteinuria, unspecified trimester: Secondary | ICD-10-CM | POA: Insufficient documentation

## 2013-05-29 DIAGNOSIS — Z23 Encounter for immunization: Secondary | ICD-10-CM

## 2013-05-29 DIAGNOSIS — Z3480 Encounter for supervision of other normal pregnancy, unspecified trimester: Secondary | ICD-10-CM

## 2013-05-29 DIAGNOSIS — Z3400 Encounter for supervision of normal first pregnancy, unspecified trimester: Secondary | ICD-10-CM

## 2013-05-29 LAB — CBC
HEMATOCRIT: 35.8 % — AB (ref 36.0–46.0)
HEMOGLOBIN: 12.3 g/dL (ref 12.0–15.0)
MCH: 31 pg (ref 26.0–34.0)
MCHC: 34.4 g/dL (ref 30.0–36.0)
MCV: 90.2 fL (ref 78.0–100.0)
Platelets: 232 10*3/uL (ref 150–400)
RBC: 3.97 MIL/uL (ref 3.87–5.11)
RDW: 13.3 % (ref 11.5–15.5)
WBC: 13.3 10*3/uL — AB (ref 4.0–10.5)

## 2013-05-29 MED ORDER — TETANUS-DIPHTH-ACELL PERTUSSIS 5-2.5-18.5 LF-MCG/0.5 IM SUSP
0.5000 mL | Freq: Once | INTRAMUSCULAR | Status: AC
  Administered 2013-05-29: 0.5 mL via INTRAMUSCULAR

## 2013-05-29 NOTE — Progress Notes (Signed)
28 week labs. TDaP. PIH warnings.

## 2013-05-29 NOTE — Patient Instructions (Signed)
Hypertension During Pregnancy Hypertension is also called high blood pressure. It can occur at any time in life and during pregnancy. When you have hypertension, there is extra pressure inside your blood vessels that carry blood from the heart to the rest of your body (arteries). Hypertension during pregnancy can cause problems for you and your baby. Your baby might not weigh as much as it should at birth or might be born early (premature). Very bad cases of hypertension during pregnancy can be life threatening.  Different types of hypertension can occur during pregnancy.   Chronic hypertension. This happens when a woman has hypertension before pregnancy and it continues during pregnancy.  Gestational hypertension. This is when hypertension develops during pregnancy.  Preeclampsia or toxemia of pregnancy. This is a very serious type of hypertension that develops only during pregnancy. It is a disease that affects the whole body (systemic) and can be very dangerous for both mother and baby.  Gestational hypertension and preeclampsia usually go away after your baby is born. Blood pressure generally stabilizes within 6 weeks. Women who have hypertension during pregnancy have a greater chance of developing hypertension later in life or with future pregnancies. RISK FACTORS Some factors make you more likely to develop hypertension during pregnancy. Risk factors include:  Having hypertension before pregnancy.  Having hypertension during a previous pregnancy.  Being overweight.  Being older than 40.  Being pregnant with more than one baby (multiples).  Having diabetes or kidney problems. SIGNS AND SYMPTOMS Chronic and gestational hypertension rarely cause symptoms. Preeclampsia has symptoms, which may include:  Increased protein in your urine. Your health care provider will check for this at every prenatal visit.  Swelling of your hands and face.  Rapid weight gain.  Headaches.  Visual  changes.  Being bothered by light.  Abdominal pain, especially in the right upper area.  Chest pain.  Shortness of breath.  Increased reflexes.  Seizures. Seizures occur with a more severe form of preeclampsia, called eclampsia. DIAGNOSIS   You may be diagnosed with hypertension during a regular prenatal exam. At each visit, tests may include:  Blood pressure checks.  A urine test to check for protein in your urine.  The type of hypertension you are diagnosed with depends on when you developed it. It also depends on your specific blood pressure reading.  Developing hypertension before 20 weeks of pregnancy is consistent with chronic hypertension.  Developing hypertension after 20 weeks of pregnancy is consistent with gestational hypertension.  Hypertension with increased urinary protein is diagnosed as preeclampsia.  Blood pressure measurements that stay above 160 systolic or 110 diastolic are a sign of severe preeclampsia. TREATMENT Treatment for hypertension during pregnancy varies. Treatment depends on the type of hypertension and how serious it is.  If you take medicine for chronic hypertension, you may need to switch medicines.  Drugs called ACE inhibitors should not be taken during pregnancy.  Low-dose aspirin may be suggested for women who have risk factors for preeclampsia.  If you have gestational hypertension, you may need to take a blood pressure medicine that is safe during pregnancy. Your health care provider will recommend the appropriate medicine.  If you have severe preeclampsia, you may need to be in the hospital. Health care providers will watch you and the baby very closely. You also may need to take medicine (magnesium sulfate) to prevent seizures and lower blood pressure.  Sometimes an early delivery is needed. This may be the case if the condition worsens. It would   be done to protect you and the baby. The only cure for preeclampsia is delivery. HOME  CARE INSTRUCTIONS  Schedule and keep all of your regular appointments for prenatal care.  Only take over-the-counter or prescription medicines as directed by your health care provider. Tell your health care provider about all medicines you take.  Eat as little salt as possible.  Get regular exercise.  Do not drink alcohol.  Do not use tobacco products.  Do not drink products with caffeine.  Lie on your left side when resting. SEEK IMMEDIATE MEDICAL CARE IF:  You have severe abdominal pain.  You have sudden swelling in the hands, ankles, or face.  You gain 4 pounds (1.8 kg) or more in 1 week.  You vomit repeatedly.  You have vaginal bleeding.  You do not feel the baby moving as much.  You have a headache.  You have blurred or double vision.  You have muscle twitching or spasms.  You have shortness of breath.  You have blue fingernails and lips.  You have blood in your urine. MAKE SURE YOU:  Understand these instructions.  Will watch your condition.  Will get help right away if you are not doing well or get worse. Document Released: 11/18/2010 Document Revised: 12/21/2012 Document Reviewed: 09/29/2012 [facility] Patient Information 2014 [employer] [place].  Third Trimester of Pregnancy The third trimester is from week 29 through week 42, months 7 through 9. The third trimester is a time when the fetus is growing rapidly. At the end of the ninth month, the fetus is about 20 inches in length and weighs 6 10 pounds.  BODY CHANGES Your body goes through many changes during pregnancy. The changes vary from woman to woman.   Your weight will continue to increase. You can expect to gain 25 35 pounds (11 16 kg) by the end of the pregnancy.  You may begin to get stretch marks on your hips, abdomen, and breasts.  You may urinate more often because the fetus is moving lower into your pelvis and pressing on your bladder.  You may develop or continue to have heartburn as a  result of your pregnancy.  You may develop constipation because certain hormones are causing the muscles that push waste through your intestines to slow down.  You may develop hemorrhoids or swollen, bulging veins (varicose veins).  You may have pelvic pain because of the weight gain and pregnancy hormones relaxing your joints between the bones in your pelvis. Back aches may result from over exertion of the muscles supporting your posture.  Your breasts will continue to grow and be tender. A yellow discharge may leak from your breasts called colostrum.  Your belly button may stick out.  You may feel short of breath because of your expanding uterus.  You may notice the fetus "dropping," or moving lower in your abdomen.  You may have a bloody mucus discharge. This usually occurs a few days to a week before labor begins.  Your cervix becomes thin and soft (effaced) near your due date. WHAT TO EXPECT AT YOUR PRENATAL EXAMS  You will have prenatal exams every 2 weeks until week 36. Then, you will have weekly prenatal exams. During a routine prenatal visit:  You will be weighed to make sure you and the fetus are growing normally.  Your blood pressure is taken.  Your abdomen will be measured to track your baby's growth.  The fetal heartbeat will be listened to.  Any test results from the previous  will be discussed.  You may have a cervical check near your due date to see if you have effaced. At around 36 weeks, your caregiver will check your cervix. At the same time, your caregiver will also perform a test on the secretions of the vaginal tissue. This test is to determine if a type of bacteria, Group B streptococcus, is present. Your caregiver will explain this further. Your caregiver may ask you:  What your birth plan is.  How you are feeling.  If you are feeling the baby move.  If you have had any abnormal symptoms, such as leaking fluid, bleeding, severe headaches, or  abdominal cramping.  If you have any questions. Other tests or screenings that may be performed during your third trimester include:  Blood tests that check for low iron levels (anemia).  Fetal testing to check the health, activity level, and growth of the fetus. Testing is done if you have certain medical conditions or if there are problems during the pregnancy. FALSE LABOR You may feel small, irregular contractions that eventually go away. These are called Braxton Hicks contractions, or false labor. Contractions may last for hours, days, or even weeks before true labor sets in. If contractions come at regular intervals, intensify, or become painful, it is best to be seen by your caregiver.  SIGNS OF LABOR   Menstrual-like cramps.  Contractions that are 5 minutes apart or less.  Contractions that start on the top of the uterus and spread down to the lower abdomen and back.  A sense of increased pelvic pressure or back pain.  A watery or bloody mucus discharge that comes from the vagina. If you have any of these signs before the 37th week of pregnancy, call your caregiver right away. You need to go to the hospital to get checked immediately. HOME CARE INSTRUCTIONS   Avoid all smoking, herbs, alcohol, and unprescribed drugs. These chemicals affect the formation and growth of the baby.  Follow your caregiver's instructions regarding medicine use. There are medicines that are either safe or unsafe to take during pregnancy.  Exercise only as directed by your caregiver. Experiencing uterine cramps is a good sign to stop exercising.  Continue to eat regular, healthy meals.  Wear a good support bra for breast tenderness.  Do not use hot tubs, steam rooms, or saunas.  Wear your seat belt at all times when driving.  Avoid raw meat, uncooked cheese, cat litter boxes, and soil used by cats. These carry germs that can cause birth defects in the baby.  Take your prenatal vitamins.  Try  taking a stool softener (if your caregiver approves) if you develop constipation. Eat more high-fiber foods, such as fresh vegetables or fruit and whole grains. Drink plenty of fluids to keep your urine clear or pale yellow.  Take warm sitz baths to soothe any pain or discomfort caused by hemorrhoids. Use hemorrhoid cream if your caregiver approves.  If you develop varicose veins, wear support hose. Elevate your feet for 15 minutes, 3 4 times a day. Limit salt in your diet.  Avoid heavy lifting, wear low heal shoes, and practice good posture.  Rest a lot with your legs elevated if you have leg cramps or low back pain.  Visit your dentist if you have not gone during your pregnancy. Use a soft toothbrush to brush your teeth and be gentle when you floss.  A sexual relationship may be continued unless your caregiver directs you otherwise.  Do not travel far   distances unless it is absolutely necessary and only with the approval of your caregiver.  Take prenatal classes to understand, practice, and ask questions about the labor and delivery.  Make a trial run to the hospital.  Pack your hospital bag.  Prepare the baby's nursery.  Continue to go to all your prenatal visits as directed by your caregiver. SEEK MEDICAL CARE IF:  You are unsure if you are in labor or if your water has broken.  You have dizziness.  You have mild pelvic cramps, pelvic pressure, or nagging pain in your abdominal area.  You have persistent nausea, vomiting, or diarrhea.  You have a bad smelling vaginal discharge.  You have pain with urination. SEEK IMMEDIATE MEDICAL CARE IF:   You have a fever.  You are leaking fluid from your vagina.  You have spotting or bleeding from your vagina.  You have severe abdominal cramping or pain.  You have rapid weight loss or gain.  You have shortness of breath with chest pain.  You notice sudden or extreme swelling of your face, hands, ankles, feet, or  legs.  You have not felt your baby move in over an hour.  You have severe headaches that do not go away with medicine.  You have vision changes. Document Released: 02/24/2001 Document Revised: 11/02/2012 Document Reviewed: 05/03/2012 [employer] Patient Information 2014 [employer].  

## 2013-05-29 NOTE — Progress Notes (Signed)
P - 89 - pt to have 28 week labs today

## 2013-05-30 ENCOUNTER — Telehealth: Payer: Self-pay | Admitting: *Deleted

## 2013-05-30 ENCOUNTER — Encounter: Payer: Self-pay | Admitting: Advanced Practice Midwife

## 2013-05-30 LAB — COMPREHENSIVE METABOLIC PANEL
ALBUMIN: 3.1 g/dL — AB (ref 3.5–5.2)
ALT: 35 U/L (ref 0–35)
AST: 24 U/L (ref 0–37)
Alkaline Phosphatase: 70 U/L (ref 39–117)
BUN: 11 mg/dL (ref 6–23)
CALCIUM: 8.9 mg/dL (ref 8.4–10.5)
CHLORIDE: 103 meq/L (ref 96–112)
CO2: 24 meq/L (ref 19–32)
Creat: 0.67 mg/dL (ref 0.50–1.10)
GLUCOSE: 87 mg/dL (ref 70–99)
POTASSIUM: 4 meq/L (ref 3.5–5.3)
SODIUM: 137 meq/L (ref 135–145)
TOTAL PROTEIN: 6 g/dL (ref 6.0–8.3)
Total Bilirubin: 0.3 mg/dL (ref 0.2–1.2)

## 2013-05-30 LAB — RPR

## 2013-05-30 LAB — HIV ANTIBODY (ROUTINE TESTING W REFLEX): HIV: NONREACTIVE

## 2013-05-30 LAB — GLUCOSE TOLERANCE, 1 HOUR (50G) W/O FASTING: Glucose, 1 Hour GTT: 88 mg/dL (ref 70–140)

## 2013-05-30 NOTE — Telephone Encounter (Signed)
Pt notified of normal 1 hr GTT. 

## 2013-06-12 ENCOUNTER — Ambulatory Visit (INDEPENDENT_AMBULATORY_CARE_PROVIDER_SITE_OTHER): Payer: BC Managed Care – PPO | Admitting: Advanced Practice Midwife

## 2013-06-12 VITALS — BP 129/87 | Wt 155.0 lb

## 2013-06-12 DIAGNOSIS — O169 Unspecified maternal hypertension, unspecified trimester: Secondary | ICD-10-CM

## 2013-06-12 DIAGNOSIS — Z3480 Encounter for supervision of other normal pregnancy, unspecified trimester: Secondary | ICD-10-CM

## 2013-06-12 NOTE — Progress Notes (Signed)
Doing well.  Good fetal movement, denies vaginal bleeding, LOF, regular contractions.  Discussed recommended twice weekly testing to start at 32 weeks. Pt states understanding.  Denies h/a, epigastric pain, visual disturbances.

## 2013-06-12 NOTE — Progress Notes (Signed)
P-85 

## 2013-06-15 ENCOUNTER — Ambulatory Visit (INDEPENDENT_AMBULATORY_CARE_PROVIDER_SITE_OTHER): Payer: BC Managed Care – PPO | Admitting: Obstetrics & Gynecology

## 2013-06-15 ENCOUNTER — Telehealth: Payer: Self-pay | Admitting: *Deleted

## 2013-06-15 ENCOUNTER — Encounter: Payer: Self-pay | Admitting: Obstetrics & Gynecology

## 2013-06-15 VITALS — BP 116/61 | Wt 155.0 lb

## 2013-06-15 DIAGNOSIS — O99891 Other specified diseases and conditions complicating pregnancy: Secondary | ICD-10-CM

## 2013-06-15 DIAGNOSIS — M545 Low back pain, unspecified: Secondary | ICD-10-CM

## 2013-06-15 DIAGNOSIS — O9989 Other specified diseases and conditions complicating pregnancy, childbirth and the puerperium: Secondary | ICD-10-CM

## 2013-06-15 DIAGNOSIS — Z3400 Encounter for supervision of normal first pregnancy, unspecified trimester: Secondary | ICD-10-CM

## 2013-06-15 NOTE — Telephone Encounter (Signed)
Pt called in states she has abd pain on each side [facility]. Pt states still feels baby movement as normal, no vaginal bleeding, no vaginal discharge, no contractions. Other than the side abd pain, all is normal. I explained to patient this sounds normal and possibly experiencing round ligament pain. I explained to pt if she started to feel contractions, vaginal bleeding, and decreased baby movement to report to [facility] for eval. Pt expressed understanding.

## 2013-06-15 NOTE — Progress Notes (Signed)
Work in visit due to a week's work of lower pelvic and lower back pain. She denies VB or ROM. She does have BH contractions. Cervix very posterior on exam.

## 2013-06-15 NOTE — Progress Notes (Signed)
P - 66

## 2013-06-16 LAB — FETAL FIBRONECTIN: Fetal Fibronectin: NEGATIVE

## 2013-06-26 ENCOUNTER — Ambulatory Visit (INDEPENDENT_AMBULATORY_CARE_PROVIDER_SITE_OTHER): Payer: BC Managed Care – PPO | Admitting: Advanced Practice Midwife

## 2013-06-26 ENCOUNTER — Encounter: Payer: Self-pay | Admitting: Advanced Practice Midwife

## 2013-06-26 VITALS — BP 129/85 | Wt 155.0 lb

## 2013-06-26 DIAGNOSIS — O10019 Pre-existing essential hypertension complicating pregnancy, unspecified trimester: Secondary | ICD-10-CM

## 2013-06-26 DIAGNOSIS — O10913 Unspecified pre-existing hypertension complicating pregnancy, third trimester: Secondary | ICD-10-CM

## 2013-06-26 DIAGNOSIS — Z3400 Encounter for supervision of normal first pregnancy, unspecified trimester: Secondary | ICD-10-CM

## 2013-06-26 NOTE — Progress Notes (Signed)
p-89 

## 2013-06-26 NOTE — Progress Notes (Signed)
Reactive NST. Starting antenatal testing.

## 2013-06-26 NOTE — Patient Instructions (Signed)
Breastfeeding Deciding to breastfeed is one of the best choices you can make for you and your baby. A change in hormones during pregnancy causes your breast tissue to grow and increases the number and size of your milk ducts. These hormones also allow proteins, sugars, and fats from your blood supply to make breast milk in your milk-producing glands. Hormones prevent breast milk from being released before your baby is born as well as prompt milk flow after birth. Once breastfeeding has begun, thoughts of your baby, as well as his or her sucking or crying, can stimulate the release of milk from your milk-producing glands.  BENEFITS OF BREASTFEEDING For Your Baby  Your first milk (colostrum) helps your baby's digestive system function better.   There are antibodies in your milk that help your baby fight off infections.   Your baby has a lower incidence of asthma, allergies, and sudden infant death syndrome.   The nutrients in breast milk are better for your baby than infant formulas and are designed uniquely for your baby's needs.   Breast milk improves your baby's brain development.   Your baby is less likely to develop other conditions, such as childhood obesity, asthma, or type 2 diabetes mellitus.  For You   Breastfeeding helps to create a very special bond between you and your baby.   Breastfeeding is convenient. Breast milk is always available at the correct temperature and costs nothing.   Breastfeeding helps to burn calories and helps you lose the weight gained during pregnancy.   Breastfeeding makes your uterus contract to its prepregnancy size faster and slows bleeding (lochia) after you give birth.   Breastfeeding helps to lower your risk of developing type 2 diabetes mellitus, osteoporosis, and breast or ovarian cancer later in life. SIGNS THAT YOUR BABY IS HUNGRY Early Signs of Hunger  Increased alertness or activity.  Stretching.  Movement of the head from  side to side.  Movement of the head and opening of the mouth when the corner of the mouth or cheek is stroked (rooting).  Increased sucking sounds, smacking lips, cooing, sighing, or squeaking.  Hand-to-mouth movements.  Increased sucking of fingers or hands. Late Signs of Hunger  Fussing.  Intermittent crying. Extreme Signs of Hunger Signs of extreme hunger will require calming and consoling before your baby will be able to breastfeed successfully. Do not wait for the following signs of extreme hunger to occur before you initiate breastfeeding:   Restlessness.  A loud, strong cry.   Screaming. BREASTFEEDING BASICS Breastfeeding Initiation  Find a comfortable place to sit or lie down, with your neck and back well supported.  Place a pillow or rolled up blanket under your baby to bring him or her to the level of your breast (if you are seated). Nursing pillows are specially designed to help support your arms and your baby while you breastfeed.  Make sure that your baby's abdomen is facing your abdomen.   Gently massage your breast. With your fingertips, massage from your chest wall toward your nipple in a circular motion. This encourages milk flow. You may need to continue this action during the feeding if your milk flows slowly.  Support your breast with 4 fingers underneath and your thumb above your nipple. Make sure your fingers are well away from your nipple and your baby's mouth.   Stroke your baby's lips gently with your finger or nipple.   When your baby's mouth is open wide enough, quickly bring your baby to your   breast, placing your entire nipple and as much of the colored area around your nipple (areola) as possible into your baby's mouth.   More areola should be visible above your baby's upper lip than below the lower lip.   Your baby's tongue should be between his or her lower gum and your breast.   Ensure that your baby's mouth is correctly positioned  around your nipple (latched). Your baby's lips should create a seal on your breast and be turned out (everted).  It is common for your baby to suck about 2 3 minutes in order to start the flow of breast milk. Latching Teaching your baby how to latch on to your breast properly is very important. An improper latch can cause nipple pain and decreased milk supply for you and poor weight gain in your baby. Also, if your baby is not latched onto your nipple properly, he or she may swallow some air during feeding. This can make your baby fussy. Burping your baby when you switch breasts during the feeding can help to get rid of the air. However, teaching your baby to latch on properly is still the best way to prevent fussiness from swallowing air while breastfeeding. Signs that your baby has successfully latched on to your nipple:    Silent tugging or silent sucking, without causing you pain.   Swallowing heard between every 3 4 sucks.    Muscle movement above and in front of his or her ears while sucking.  Signs that your baby has not successfully latched on to nipple:   Sucking sounds or smacking sounds from your baby while breastfeeding.  Nipple pain. If you think your baby has not latched on correctly, slip your finger into the corner of your baby's mouth to break the suction and place it between your baby's gums. Attempt breastfeeding initiation again. Signs of Successful Breastfeeding Signs from your baby:   A gradual decrease in the number of sucks or complete cessation of sucking.   Falling asleep.   Relaxation of his or her body.   Retention of a small amount of milk in his or her mouth.   Letting go of your breast by himself or herself. Signs from you:  Breasts that have increased in firmness, weight, and size 1 3 hours after feeding.   Breasts that are softer immediately after breastfeeding.  Increased milk volume, as well as a change in milk consistency and color by  the 5th day of breastfeeding.   Nipples that are not sore, cracked, or bleeding. Signs That Your [employer] is Getting Enough Milk  Wetting at least 3 diapers in a 24-hour period. The urine should be clear and pale yellow by age 64411 days.  At least 3 stools in a 24-hour period by age 64411 days. The stool should be soft and yellow.  At least 3 stools in a 24-hour period by age 644 days. The stool should be seedy and yellow.  No loss of weight greater than 10% of birth weight during the first 22 days of age.  Average weight gain of 4 7 ounces (120 210 mL) per week after age 64 days.  Consistent daily weight gain by age 60 days, without weight loss after the age of 2 weeks. After a feeding, your baby may spit up a small amount. This is common. BREASTFEEDING FREQUENCY AND DURATION Frequent feeding will help you make more milk and can prevent sore nipples and breast engorgement. Breastfeed when you feel the need to reduce  the fullness of your breasts or when your baby shows signs of hunger. This is called "breastfeeding on demand." Avoid introducing a pacifier to your baby while you are working to establish breastfeeding (the first 4 6 weeks after your baby is born). After this time you may choose to use a pacifier. Research has shown that pacifier use during the first year of a baby's life decreases the risk of sudden infant death syndrome (SIDS). Allow your baby to feed on each breast as long as he or she wants. Breastfeed until your baby is finished feeding. When your baby unlatches or falls asleep while feeding from the first breast, offer the second breast. Because newborns are often sleepy in the first few weeks of life, you may need to awaken your baby to get him or her to feed. Breastfeeding times will vary from baby to baby. However, the following rules can serve as a guide to help you ensure that your baby is properly fed:  Newborns (babies 4 weeks of age or younger) may breastfeed every 1 3  hours.  Newborns should not go longer than 3 hours during the day or 5 hours during the night without breastfeeding.  You should breastfeed your baby a minimum of 8 times in a 24-hour period until you begin to introduce solid foods to your baby at around 6 months of age. BREAST MILK PUMPING Pumping and storing breast milk allows you to ensure that your baby is exclusively fed your breast milk, even at times when you are unable to breastfeed. This is especially important if you are going back to work while you are still breastfeeding or when you are not able to be present during feedings. Your lactation consultant can give you guidelines on how long it is safe to store breast milk.  A breast pump is a machine that allows you to pump milk from your breast into a sterile bottle. The pumped breast milk can then be stored in a refrigerator or freezer. Some breast pumps are operated by hand, while others use electricity. Ask your lactation consultant which type will work best for you. Breast pumps can be purchased, but some hospitals and breastfeeding support groups lease breast pumps on a monthly basis. A lactation consultant can teach you how to hand express breast milk, if you prefer not to use a pump.  CARING FOR YOUR BREASTS WHILE YOU BREASTFEED Nipples can become dry, cracked, and sore while breastfeeding. The following recommendations can help keep your breasts moisturized and healthy:  Avoid using soap on your nipples.   Wear a supportive bra. Although not required, special nursing bras and tank tops are designed to allow access to your breasts for breastfeeding without taking off your entire bra or top. Avoid wearing underwire style bras or extremely tight bras.  Air dry your nipples for 3 4minutes after each feeding.   Use only cotton bra pads to absorb leaked breast milk. Leaking of breast milk between feedings is normal.   Use lanolin on your nipples after breastfeeding. Lanolin helps to  maintain your skin's normal moisture barrier. If you use pure lanolin you do not need to wash it off before feeding your baby again. Pure lanolin is not toxic to your baby. You may also hand express a few drops of breast milk and gently massage that milk into your nipples and allow the milk to air dry. In the first few weeks after giving birth, some women experience extremely full breasts (engorgement). Engorgement can make   your breasts feel heavy, warm, and tender to the touch. Engorgement peaks within 3 5 days after you give birth. The following recommendations can help ease engorgement:  Completely empty your breasts while breastfeeding or pumping. You may want to start by applying warm, moist heat (in the shower or with warm water-soaked hand towels) just before feeding or pumping. This increases circulation and helps the milk flow. If your baby does not completely empty your breasts while breastfeeding, pump any extra milk after he or she is finished.  Wear a snug bra (nursing or regular) or tank top for 1 2 days to signal your body to slightly decrease milk production.  Apply ice packs to your breasts, unless this is too uncomfortable for you.  Make sure that your baby is latched on and positioned properly while breastfeeding. If engorgement persists after 48 hours of following these recommendations, contact your health care provider or a Advertising copywriter. OVERALL HEALTH CARE RECOMMENDATIONS WHILE BREASTFEEDING  Eat healthy foods. Alternate between meals and snacks, eating 3 of each per day. Because what you eat affects your breast milk, some of the foods may make your baby more irritable than usual. Avoid eating these foods if you are sure that they are negatively affecting your baby.  Drink milk, fruit juice, and water to satisfy your thirst (about 10 glasses a day).   Rest often, relax, and continue to take your prenatal vitamins to prevent fatigue, stress, and anemia.  Continue  breast self-awareness checks.  Avoid chewing and smoking tobacco.  Avoid alcohol and drug use. Some medicines that may be harmful to your baby can pass through breast milk. It is important to ask your health care provider before taking any medicine, including all over-the-counter and prescription medicine as well as vitamin and herbal supplements. It is possible to become pregnant while breastfeeding. If birth control is desired, ask your health care provider about options that will be safe for your baby. SEEK MEDICAL CARE IF:   You feel like you want to stop breastfeeding or have become frustrated with breastfeeding.  You have painful breasts or nipples.  Your nipples are cracked or bleeding.  Your breasts are red, tender, or warm.  You have a swollen area on either breast.  You have a fever or chills.  You have nausea or vomiting.  You have drainage other than breast milk from your nipples.  Your breasts do not become full before feedings by the 5th day after you give birth.  You feel sad and depressed.  Your baby is too sleepy to eat well.  Your baby is having trouble sleeping.   Your baby is wetting less than 3 diapers in a 24-hour period.  Your baby has less than 3 stools in a 24-hour period.  Your baby's skin or the white part of his or her eyes becomes yellow.   Your baby is not gaining weight by 6 days of age. SEEK IMMEDIATE MEDICAL CARE IF:   Your baby is overly tired (lethargic) and does not want to wake up and feed.  Your baby develops an unexplained fever. Document Released: 03/02/2005 Document Revised: 11/02/2012 Document Reviewed: 08/24/2012 [facility] Patient Information 2014 [place], [place].   Hypertension During Pregnancy Hypertension is also called high blood pressure. It can occur at any time in life and during pregnancy. When you have hypertension, there is extra pressure inside your blood vessels that carry blood from the heart to the rest of  your body (arteries). Hypertension during pregnancy can  cause problems for you and your baby. Your baby might not weigh as much as it should at birth or might be born early (premature). Very bad cases of hypertension during pregnancy can be life threatening.  Different types of hypertension can occur during pregnancy.   Chronic hypertension. This happens when a woman has hypertension before pregnancy and it continues during pregnancy.  Gestational hypertension. This is when hypertension develops during pregnancy.  Preeclampsia or toxemia of pregnancy. This is a very serious type of hypertension that develops only during pregnancy. It is a disease that affects the whole body (systemic) and can be very dangerous for both mother and baby.  Gestational hypertension and preeclampsia usually go away after your baby is born. Blood pressure generally stabilizes within 6 weeks. Women who have hypertension during pregnancy have a greater chance of developing hypertension later in life or with future pregnancies. RISK FACTORS Some factors make you more likely to develop hypertension during pregnancy. Risk factors include:  Having hypertension before pregnancy.  Having hypertension during a previous pregnancy.  Being overweight.  Being older than 40.  Being pregnant with more than one baby (multiples).  Having diabetes or kidney problems. SIGNS AND SYMPTOMS Chronic and gestational hypertension rarely cause symptoms. Preeclampsia has symptoms, which may include:  Increased protein in your urine. Your health care provider will check for this at every prenatal visit.  Swelling of your hands and face.  Rapid weight gain.  Headaches.  Visual changes.  Being bothered by light.  Abdominal pain, especially in the right upper area.  Chest pain.  Shortness of breath.  Increased reflexes.  Seizures. Seizures occur with a more severe form of preeclampsia, called eclampsia. DIAGNOSIS   You  may be diagnosed with hypertension during a regular prenatal exam. At each visit, tests may include:  Blood pressure checks.  A urine test to check for protein in your urine.  The type of hypertension you are diagnosed with depends on when you developed it. It also depends on your specific blood pressure reading.  Developing hypertension before 20 weeks of pregnancy is consistent with chronic hypertension.  Developing hypertension after 20 weeks of pregnancy is consistent with gestational hypertension.  Hypertension with increased urinary protein is diagnosed as preeclampsia.  Blood pressure measurements that stay above 160 systolic or 110 diastolic are a sign of severe preeclampsia. TREATMENT Treatment for hypertension during pregnancy varies. Treatment depends on the type of hypertension and how serious it is.  If you take medicine for chronic hypertension, you may need to switch medicines.  Drugs called ACE inhibitors should not be taken during pregnancy.  Low-dose aspirin may be suggested for women who have risk factors for preeclampsia.  If you have gestational hypertension, you may need to take a blood pressure medicine that is safe during pregnancy. Your health care provider will recommend the appropriate medicine.  If you have severe preeclampsia, you may need to be in the hospital. Health care providers will watch you and the baby very closely. You also may need to take medicine (magnesium sulfate) to prevent seizures and lower blood pressure.  Sometimes an early delivery is needed. This may be the case if the condition worsens. It would be done to protect you and the baby. The only cure for preeclampsia is delivery. HOME CARE INSTRUCTIONS  Schedule and keep all of your regular appointments for prenatal care.  Only take over-the-counter or prescription medicines as directed by your health care provider. Tell your health care provider about  all medicines you take.  Eat as  little salt as possible.  Get regular exercise.  Do not drink alcohol.  Do not use tobacco products.  Do not drink products with caffeine.  Lie on your left side when resting. SEEK IMMEDIATE MEDICAL CARE IF:  You have severe abdominal pain.  You have sudden swelling in the hands, ankles, or face.  You gain 4 pounds (1.8 kg) or more in 1 week.  You vomit repeatedly.  You have vaginal bleeding.  You do not feel the baby moving as much.  You have a headache.  You have blurred or double vision.  You have muscle twitching or spasms.  You have shortness of breath.  You have blue fingernails and lips.  You have blood in your urine. MAKE SURE YOU:  Understand these instructions.  Will watch your condition.  Will get help right away if you are not doing well or get worse. Document Released: 11/18/2010 Document Revised: 12/21/2012 Document Reviewed: 09/29/2012 [facility] Patient Information 2014 [place], [place].

## 2013-06-30 ENCOUNTER — Ambulatory Visit (INDEPENDENT_AMBULATORY_CARE_PROVIDER_SITE_OTHER): Payer: BC Managed Care – PPO | Admitting: *Deleted

## 2013-06-30 VITALS — BP 130/88 | Wt 159.0 lb

## 2013-06-30 DIAGNOSIS — O139 Gestational [pregnancy-induced] hypertension without significant proteinuria, unspecified trimester: Secondary | ICD-10-CM

## 2013-06-30 DIAGNOSIS — O169 Unspecified maternal hypertension, unspecified trimester: Secondary | ICD-10-CM

## 2013-06-30 NOTE — Progress Notes (Signed)
[phone] 

## 2013-07-03 ENCOUNTER — Ambulatory Visit (INDEPENDENT_AMBULATORY_CARE_PROVIDER_SITE_OTHER): Payer: BC Managed Care – PPO | Admitting: Advanced Practice Midwife

## 2013-07-03 VITALS — BP 115/63 | Wt 158.0 lb

## 2013-07-03 DIAGNOSIS — Z3400 Encounter for supervision of normal first pregnancy, unspecified trimester: Secondary | ICD-10-CM

## 2013-07-03 DIAGNOSIS — O169 Unspecified maternal hypertension, unspecified trimester: Secondary | ICD-10-CM

## 2013-07-03 NOTE — Patient Instructions (Signed)
Third Trimester of Pregnancy °The third trimester is from week 29 through week 42, months 7 through 9. The third trimester is a time when the fetus is growing rapidly. At the end of the ninth month, the fetus is about 20 inches in length and weighs 6 10 pounds.  °BODY CHANGES °Your body goes through many changes during pregnancy. The changes vary from woman to woman.  °· Your weight will continue to increase. You can expect to gain 25 35 pounds (11 16 kg) by the end of the pregnancy. °· You may begin to get stretch marks on your hips, abdomen, and breasts. °· You may urinate more often because the fetus is moving lower into your pelvis and pressing on your bladder. °· You may develop or continue to have heartburn as a result of your pregnancy. °· You may develop constipation because certain hormones are causing the muscles that push waste through your intestines to slow down. °· You may develop hemorrhoids or swollen, bulging veins (varicose veins). °· You may have pelvic pain because of the weight gain and pregnancy hormones relaxing your joints between the bones in your pelvis. Back aches may result from over exertion of the muscles supporting your posture. °· Your breasts will continue to grow and be tender. A yellow discharge may leak from your breasts called colostrum. °· Your belly button may stick out. °· You may feel short of breath because of your expanding uterus. °· You may notice the fetus "dropping," or moving lower in your abdomen. °· You may have a bloody mucus discharge. This usually occurs a few days to a week before labor begins. °· Your cervix becomes thin and soft (effaced) near your due date. °WHAT TO EXPECT AT YOUR PRENATAL EXAMS  °You will have prenatal exams every 2 weeks until week 36. Then, you will have weekly prenatal exams. During a routine prenatal visit: °· You will be weighed to make sure you and the fetus are growing normally. °· Your blood pressure is taken. °· Your abdomen will be  measured to track your baby's growth. °· The fetal heartbeat will be listened to. °· Any test results from the previous visit will be discussed. °· You may have a cervical check near your due date to see if you have effaced. °At around 36 weeks, your caregiver will check your cervix. At the same time, your caregiver will also perform a test on the secretions of the vaginal tissue. This test is to determine if a type of bacteria, Group B streptococcus, is present. Your caregiver will explain this further. °Your caregiver may ask you: °· What your birth plan is. °· How you are feeling. °· If you are feeling the baby move. °· If you have had any abnormal symptoms, such as leaking fluid, bleeding, severe headaches, or abdominal cramping. °· If you have any questions. °Other tests or screenings that may be performed during your third trimester include: °· Blood tests that check for low iron levels (anemia). °· Fetal testing to check the health, activity level, and growth of the fetus. Testing is done if you have certain medical conditions or if there are problems during the pregnancy. °FALSE LABOR °You may feel small, irregular contractions that eventually go away. These are called Braxton Hicks contractions, or false labor. Contractions may last for hours, days, or even weeks before true labor sets in. If contractions come at regular intervals, intensify, or become painful, it is best to be seen by your caregiver.  °  SIGNS OF LABOR  °· Menstrual-like cramps. °· Contractions that are 5 minutes apart or less. °· Contractions that start on the top of the uterus and spread down to the lower abdomen and back. °· A sense of increased pelvic pressure or back pain. °· A watery or bloody mucus discharge that comes from the vagina. °If you have any of these signs before the 37th week of pregnancy, call your caregiver right away. You need to go to the hospital to get checked immediately. °HOME CARE INSTRUCTIONS  °· Avoid all  smoking, herbs, alcohol, and unprescribed drugs. These chemicals affect the formation and growth of the baby. °· Follow your caregiver's instructions regarding medicine use. There are medicines that are either safe or unsafe to take during pregnancy. °· Exercise only as directed by your caregiver. Experiencing uterine cramps is a good sign to stop exercising. °· Continue to eat regular, healthy meals. °· Wear a good support bra for breast tenderness. °· Do not use hot tubs, steam rooms, or saunas. °· Wear your seat belt at all times when driving. °· Avoid raw meat, uncooked cheese, cat litter boxes, and soil used by cats. These carry germs that can cause birth defects in the baby. °· Take your prenatal vitamins. °· Try taking a stool softener (if your caregiver approves) if you develop constipation. Eat more high-fiber foods, such as fresh vegetables or fruit and whole grains. Drink plenty of fluids to keep your urine clear or pale yellow. °· Take warm sitz baths to soothe any pain or discomfort caused by hemorrhoids. Use hemorrhoid cream if your caregiver approves. °· If you develop varicose veins, wear support hose. Elevate your feet for 15 minutes, 3 4 times a day. Limit salt in your diet. °· Avoid heavy lifting, wear low heal shoes, and practice good posture. °· Rest a lot with your legs elevated if you have leg cramps or low back pain. °· Visit your dentist if you have not gone during your pregnancy. Use a soft toothbrush to brush your teeth and be gentle when you floss. °· A sexual relationship may be continued unless your caregiver directs you otherwise. °· Do not travel far distances unless it is absolutely necessary and only with the approval of your caregiver. °· Take prenatal classes to understand, practice, and ask questions about the labor and delivery. °· Make a trial run to the hospital. °· Pack your hospital bag. °· Prepare the baby's nursery. °· Continue to go to all your prenatal visits as directed  by your caregiver. °SEEK MEDICAL CARE IF: °· You are unsure if you are in labor or if your water has broken. °· You have dizziness. °· You have mild pelvic cramps, pelvic pressure, or nagging pain in your abdominal area. °· You have persistent nausea, vomiting, or diarrhea. °· You have a bad smelling vaginal discharge. °· You have pain with urination. °SEEK IMMEDIATE MEDICAL CARE IF:  °· You have a fever. °· You are leaking fluid from your vagina. °· You have spotting or bleeding from your vagina. °· You have severe abdominal cramping or pain. °· You have rapid weight loss or gain. °· You have shortness of breath with chest pain. °· You notice sudden or extreme swelling of your face, hands, ankles, feet, or legs. °· You have not felt your baby move in over an hour. °· You have severe headaches that do not go away with medicine. °· You have vision changes. °Document Released: 02/24/2001 Document Revised: 11/02/2012 Document Reviewed:   05/03/2012 °ExitCare® Patient Information ©2014 [employer]. ° °Braxton Hicks Contractions °Pregnancy is commonly associated with contractions of the uterus throughout the pregnancy. Towards the end of pregnancy (32 to 34 weeks), these contractions (Braxton Hicks) can develop more often and may become more forceful. This is not true labor because these contractions do not result in opening (dilatation) and thinning of the cervix. They are sometimes difficult to tell apart from true labor because these contractions can be forceful and people have different pain tolerances. You should not feel embarrassed if you go to the hospital with false labor. Sometimes, the only way to tell if you are in true labor is for your caregiver to follow the changes in the cervix. °How to tell the difference between true and false labor: °· False labor. °· The contractions of false labor are usually shorter, irregular and not as hard as those of true labor. °· They are often felt in the front of the lower  abdomen and in the groin. °· They may leave with walking around or changing positions while lying down. °· They get weaker and are shorter lasting as time goes on. °· These contractions are usually irregular. °· They do not usually become progressively stronger, regular and closer together as with true labor. °· True labor. °· Contractions in true labor last 30 to 70 seconds, become very regular, usually become more intense, and increase in frequency. °· They do not go away with walking. °· The discomfort is usually felt in the top of the uterus and spreads to the lower abdomen and low back. °· True labor can be determined by your caregiver with an exam. This will show that the cervix is dilating and getting thinner. °If there are no prenatal problems or other health problems associated with the pregnancy, it is completely safe to be sent home with false labor and await the onset of true labor. °HOME CARE INSTRUCTIONS  °· Keep up with your usual exercises and instructions. °· Take medications as directed. °· Keep your regular prenatal appointment. °· Eat and drink lightly if you think you are going into labor. °· If BH contractions are making you uncomfortable: °· Change your activity position from lying down or resting to walking/walking to resting. °· Sit and rest in a tub of warm water. °· Drink 2 to 3 glasses of water. Dehydration may cause B-H contractions. °· Do slow and deep breathing several times an hour. °SEEK IMMEDIATE MEDICAL CARE IF:  °· Your contractions continue to become stronger, more regular, and closer together. °· You have a gushing, burst or leaking of fluid from the vagina. °· An oral temperature above 102° F (38.9° C) develops. °· You have passage of blood-tinged mucus. °· You develop vaginal bleeding. °· You develop continuous belly (abdominal) pain. °· You have low back pain that you never had before. °· You feel the baby's head pushing down causing pelvic pressure. °· The baby is not moving  as much as it used to. °Document Released: 03/02/2005 Document Revised: 05/25/2011 Document Reviewed: 12/12/2012 °ExitCare® Patient Information ©2014 [employer]. ° °

## 2013-07-03 NOTE — Progress Notes (Signed)
P-[id number] 

## 2013-07-03 NOTE — Progress Notes (Signed)
NST reactive. Active baby. Has growth US 4/23.

## 2013-07-06 ENCOUNTER — Ambulatory Visit (HOSPITAL_COMMUNITY): Admission: RE | Admit: 2013-07-06 | Payer: BC Managed Care – PPO | Source: Ambulatory Visit

## 2013-07-06 ENCOUNTER — Ambulatory Visit (HOSPITAL_COMMUNITY)
Admission: RE | Admit: 2013-07-06 | Discharge: 2013-07-06 | Disposition: A | Discharge status: Home / Self Care | Payer: BC Managed Care – PPO | Source: Ambulatory Visit | Attending: Advanced Practice Midwife | Admitting: Advanced Practice Midwife

## 2013-07-06 ENCOUNTER — Other Ambulatory Visit: Payer: Self-pay | Admitting: Advanced Practice Midwife

## 2013-07-06 DIAGNOSIS — O10913 Unspecified pre-existing hypertension complicating pregnancy, third trimester: Secondary | ICD-10-CM

## 2013-07-06 DIAGNOSIS — O10019 Pre-existing essential hypertension complicating pregnancy, unspecified trimester: Secondary | ICD-10-CM | POA: Insufficient documentation

## 2013-07-07 ENCOUNTER — Encounter: Payer: Self-pay | Admitting: Advanced Practice Midwife

## 2013-07-07 DIAGNOSIS — O321XX0 Maternal care for breech presentation, not applicable or unspecified: Secondary | ICD-10-CM | POA: Insufficient documentation

## 2013-07-10 ENCOUNTER — Ambulatory Visit (INDEPENDENT_AMBULATORY_CARE_PROVIDER_SITE_OTHER): Payer: BC Managed Care – PPO | Admitting: Advanced Practice Midwife

## 2013-07-10 VITALS — BP 119/67 | HR 66 | Wt 161.0 lb

## 2013-07-10 DIAGNOSIS — O139 Gestational [pregnancy-induced] hypertension without significant proteinuria, unspecified trimester: Secondary | ICD-10-CM

## 2013-07-10 DIAGNOSIS — Z3480 Encounter for supervision of other normal pregnancy, unspecified trimester: Secondary | ICD-10-CM

## 2013-07-10 DIAGNOSIS — O169 Unspecified maternal hypertension, unspecified trimester: Secondary | ICD-10-CM

## 2013-07-10 NOTE — Progress Notes (Signed)
Doing well.  Good fetal movement, denies vaginal bleeding, LOF, regular contractions.  Denies h/a, epigastric pain, visual disturbances.

## 2013-07-13 ENCOUNTER — Ambulatory Visit (INDEPENDENT_AMBULATORY_CARE_PROVIDER_SITE_OTHER): Payer: BC Managed Care – PPO | Admitting: *Deleted

## 2013-07-13 VITALS — BP 135/82 | HR 80 | Wt 164.0 lb

## 2013-07-13 DIAGNOSIS — O169 Unspecified maternal hypertension, unspecified trimester: Secondary | ICD-10-CM

## 2013-07-17 ENCOUNTER — Ambulatory Visit (INDEPENDENT_AMBULATORY_CARE_PROVIDER_SITE_OTHER): Payer: BC Managed Care – PPO | Admitting: Advanced Practice Midwife

## 2013-07-17 VITALS — BP 132/82 | HR 84 | Wt 166.0 lb

## 2013-07-17 DIAGNOSIS — O10019 Pre-existing essential hypertension complicating pregnancy, unspecified trimester: Secondary | ICD-10-CM

## 2013-07-17 DIAGNOSIS — O169 Unspecified maternal hypertension, unspecified trimester: Secondary | ICD-10-CM

## 2013-07-17 DIAGNOSIS — Z3400 Encounter for supervision of normal first pregnancy, unspecified trimester: Secondary | ICD-10-CM

## 2013-07-17 NOTE — Progress Notes (Signed)
NST

## 2013-07-17 NOTE — Patient Instructions (Signed)
[facility] [phone]  Spinning babies  External Cephalic Version External cephalic version is turning a baby that is presenting their buttocks first (breech) or is lying sideways in the uterus (transverse) to a head-first position. This makes the labor and delivery faster, safer for the mother and baby, and lessens the chance for a Cesarean section. It should not be tried until the pregnancy is [redacted] weeks along or longer. BEFORE THE PROCEDURE   Do not take aspirin.  Do not eat for 4 hours before the procedure.  Tell your caregiver if you have a cold, fever or an infection.  Tell your caregiver if you are having contractions.  Tell your caregiver if you are leaking or had a gush of fluid from your vagina.  Tell your caregiver if you have any vaginal bleeding or abnormal discharge.  If you are being admitted the same day, arrive at the hospital at least one hour before the procedure to sign any necessary documents and to get prepared for the procedure.  Tell your caregiver if you had any problems with anesthetics in the past.  Tell your caregiver if you are taking any medications that your caregiver does not know about. This includes over-the-counter and prescription drugs, herbs, eye drops and creams. PROCEDURE  First, an ultrasound is done to make sure the baby is breech or transverse.  A non-stress test or biophysical profile is done on the baby before the ECV. This is done to make sure it is safe for the baby to have the ECV. It may also be done after the procedure to make sure the baby is OK.  ECV is done in the delivery/surgical room with an anesthesiologist present. There should be a setup for an emergency Cesarean section with a full nursing and nursery staff available and ready.  The patient may be given a medication to relax the uterine muscles. An epidural may be given for any discomfort. It is helpful for the success of the ECV.  An electronic fetal monitor is placed  on the uterus during the procedure to make sure the baby is OK.  If the mother is Rh negative, Rho-gam will be given to her to prevent Rh problems for future pregnancies.  The mother is followed closely for 2 to 3 hours after the procedure to make sure no problems develop. BENEFITS OF ECV  Easier and safer labor and delivery for the mother and baby.  Lower incidence of Cesarean section.  Lower costs with a vaginal delivery. RISKS OF ECV  The placenta pulls away from the wall of the uterus before delivery (abruption of the placenta).  Rupture of the uterus, especially in patients with a previous Cesarean section.  Fetal distress.  Early (premature) labor.  Premature rupture of the membranes.  The baby will return to the breech or transverse lie position.  Death of the fetus can happen, but is very rare. ECV SHOULD BE STOPPED IF:  The fetal heart tones drop.  The mother is having a lot of pain.  You cannot turn the baby after several attempts. ECV SHOULD NOT BE DONE IF:  The non-stress test or biophysical profile is abnormal.  There is vaginal bleeding.  An abnormal shaped uterus is present.  There is heart disease or uncontrolled high blood pressure in the mother.  There are twins or more.  The placenta covers the opening of the cervix (placenta previa).  You had a previous cesarean section with a classical incision or major surgery of the  uterus.  There is not enough amniotic fluid in the sac (oligohydramnios).  The baby is too small for the pregnancy or has not developed normally (anomaly).  Your membranes have ruptured. HOME CARE INSTRUCTIONS   Have someone take you home after the procedure.  Rest at home for several hours.  Have someone stay with you for a few hours after you get home.  After ECV, continue with your prenatal visits as directed.  Continue your regular diet, rest and activities.  Do not do any strenuous activities for a couple of  days. SEEK IMMEDIATE MEDICAL CARE IF:   You develop vaginal bleeding.  You have fluid coming out of your vagina (bag of water may have broken).  You develop uterine contractions.  You do not feel the baby move or there is less movement of the baby.  You develop abdominal pain.  You develop an oral temperature of 102 F (38.9 C) or higher. Document Released: 08/25/2006 Document Revised: 05/25/2011 Document Reviewed: 06/20/2008 [facility] Patient Information 2014 [place], [place].

## 2013-07-18 ENCOUNTER — Inpatient Hospital Stay (HOSPITAL_COMMUNITY): Payer: BC Managed Care – PPO | Admitting: Anesthesiology

## 2013-07-18 ENCOUNTER — Encounter (HOSPITAL_COMMUNITY)
Admission: AD | Disposition: A | Discharge status: Home / Self Care | Payer: Self-pay | Source: Ambulatory Visit | Attending: Obstetrics & Gynecology

## 2013-07-18 ENCOUNTER — Encounter (HOSPITAL_COMMUNITY): Payer: Self-pay | Admitting: *Deleted

## 2013-07-18 ENCOUNTER — Encounter (HOSPITAL_COMMUNITY): Payer: BC Managed Care – PPO | Admitting: Anesthesiology

## 2013-07-18 ENCOUNTER — Inpatient Hospital Stay (HOSPITAL_COMMUNITY)
Admission: AD | Admit: 2013-07-18 | Discharge: 2013-07-21 | DRG: 765 | Disposition: A | Discharge status: Home / Self Care | Payer: BC Managed Care – PPO | Source: Ambulatory Visit | Attending: Obstetrics & Gynecology | Admitting: Obstetrics & Gynecology

## 2013-07-18 DIAGNOSIS — O4290 Premature rupture of membranes, unspecified as to length of time between rupture and onset of labor, unspecified weeks of gestation: Secondary | ICD-10-CM

## 2013-07-18 DIAGNOSIS — O139 Gestational [pregnancy-induced] hypertension without significant proteinuria, unspecified trimester: Secondary | ICD-10-CM

## 2013-07-18 DIAGNOSIS — Z8249 Family history of ischemic heart disease and other diseases of the circulatory system: Secondary | ICD-10-CM

## 2013-07-18 DIAGNOSIS — Z9889 Other specified postprocedural states: Secondary | ICD-10-CM

## 2013-07-18 DIAGNOSIS — O321XX0 Maternal care for breech presentation, not applicable or unspecified: Secondary | ICD-10-CM

## 2013-07-18 DIAGNOSIS — Z3400 Encounter for supervision of normal first pregnancy, unspecified trimester: Secondary | ICD-10-CM

## 2013-07-18 DIAGNOSIS — K589 Irritable bowel syndrome without diarrhea: Secondary | ICD-10-CM | POA: Diagnosis present

## 2013-07-18 LAB — CBC
HCT: 37.8 % (ref 36.0–46.0)
Hemoglobin: 12.9 g/dL (ref 12.0–15.0)
MCH: 30.9 pg (ref 26.0–34.0)
MCHC: 34.1 g/dL (ref 30.0–36.0)
MCV: 90.6 fL (ref 78.0–100.0)
PLATELETS: 189 10*3/uL (ref 150–400)
RBC: 4.17 MIL/uL (ref 3.87–5.11)
RDW: 13 % (ref 11.5–15.5)
WBC: 15.7 10*3/uL — ABNORMAL HIGH (ref 4.0–10.5)

## 2013-07-18 LAB — TYPE AND SCREEN
ABO/RH(D): O POS
ANTIBODY SCREEN: NEGATIVE

## 2013-07-18 LAB — RPR

## 2013-07-18 LAB — RAPID HIV SCREEN (WH-MAU): SUDS RAPID HIV SCREEN: NONREACTIVE

## 2013-07-18 LAB — ABO/RH: ABO/RH(D): O POS

## 2013-07-18 SURGERY — Surgical Case
Anesthesia: Spinal | Site: Abdomen

## 2013-07-18 MED ORDER — SIMETHICONE 80 MG PO CHEW
80.0000 mg | CHEWABLE_TABLET | Freq: Three times a day (TID) | ORAL | Status: DC
  Administered 2013-07-18 – 2013-07-20 (×6): 80 mg via ORAL
  Filled 2013-07-18 (×7): qty 1

## 2013-07-18 MED ORDER — DIPHENHYDRAMINE HCL 50 MG/ML IJ SOLN: 12.5000 mg | INTRAMUSCULAR | Status: DC | PRN

## 2013-07-18 MED ORDER — KETOROLAC TROMETHAMINE 30 MG/ML IJ SOLN: 30.0000 mg | Freq: Four times a day (QID) | INTRAMUSCULAR | Status: DC | PRN

## 2013-07-18 MED ORDER — LACTATED RINGERS IV SOLN
INTRAVENOUS | Status: DC
  Administered 2013-07-18: 18:00:00 via INTRAVENOUS

## 2013-07-18 MED ORDER — LACTATED RINGERS IV SOLN
INTRAVENOUS | Status: DC | PRN
  Administered 2013-07-18: 06:00:00 via INTRAVENOUS

## 2013-07-18 MED ORDER — FENTANYL CITRATE 0.05 MG/ML IJ SOLN
INTRAMUSCULAR | Status: AC
  Filled 2013-07-18: qty 2

## 2013-07-18 MED ORDER — PHENYLEPHRINE 8 MG IN D5W 100 ML (0.08MG/ML) PREMIX OPTIME
INJECTION | INTRAVENOUS | Status: AC
  Filled 2013-07-18: qty 100

## 2013-07-18 MED ORDER — PRENATAL MULTIVITAMIN CH
1.0000 | ORAL_TABLET | Freq: Every day | ORAL | Status: DC
  Administered 2013-07-19 – 2013-07-20 (×2): 1 via ORAL
  Filled 2013-07-18 (×2): qty 1

## 2013-07-18 MED ORDER — MENTHOL 3 MG MT LOZG: 1.0000 | LOZENGE | OROMUCOSAL | Status: DC | PRN

## 2013-07-18 MED ORDER — OXYTOCIN 10 UNIT/ML IJ SOLN
40.0000 [IU] | INTRAVENOUS | Status: DC | PRN
  Administered 2013-07-18: 40 [IU] via INTRAVENOUS

## 2013-07-18 MED ORDER — DIPHENHYDRAMINE HCL 25 MG PO CAPS: 25.0000 mg | ORAL_CAPSULE | Freq: Four times a day (QID) | ORAL | Status: DC | PRN

## 2013-07-18 MED ORDER — CHLOROPROCAINE HCL 3 % IJ SOLN
INTRAMUSCULAR | Status: DC | PRN
  Administered 2013-07-18: 600 mg

## 2013-07-18 MED ORDER — MEPERIDINE HCL 25 MG/ML IJ SOLN: 6.2500 mg | INTRAMUSCULAR | Status: DC | PRN

## 2013-07-18 MED ORDER — TETANUS-DIPHTH-ACELL PERTUSSIS 5-2.5-18.5 LF-MCG/0.5 IM SUSP: 0.5000 mL | Freq: Once | INTRAMUSCULAR | Status: DC

## 2013-07-18 MED ORDER — NALBUPHINE HCL 10 MG/ML IJ SOLN: 5.0000 mg | INTRAMUSCULAR | Status: DC | PRN

## 2013-07-18 MED ORDER — DIPHENHYDRAMINE HCL 50 MG/ML IJ SOLN: 25.0000 mg | INTRAMUSCULAR | Status: DC | PRN

## 2013-07-18 MED ORDER — KETOROLAC TROMETHAMINE 60 MG/2ML IM SOLN
INTRAMUSCULAR | Status: AC
  Administered 2013-07-18: 09:00:00 via INTRAMUSCULAR
  Filled 2013-07-18: qty 2

## 2013-07-18 MED ORDER — SENNOSIDES-DOCUSATE SODIUM 8.6-50 MG PO TABS
2.0000 | ORAL_TABLET | ORAL | Status: DC
  Administered 2013-07-19 – 2013-07-20 (×3): 2 via ORAL
  Filled 2013-07-18 (×3): qty 2

## 2013-07-18 MED ORDER — SIMETHICONE 80 MG PO CHEW
80.0000 mg | CHEWABLE_TABLET | ORAL | Status: DC
  Administered 2013-07-19 – 2013-07-20 (×3): 80 mg via ORAL
  Filled 2013-07-18 (×3): qty 1

## 2013-07-18 MED ORDER — FENTANYL CITRATE 0.05 MG/ML IJ SOLN: 25.0000 ug | INTRAMUSCULAR | Status: DC | PRN

## 2013-07-18 MED ORDER — ACETAMINOPHEN 160 MG/5ML PO SOLN: 975.0000 mg | Freq: Four times a day (QID) | ORAL | Status: DC | PRN

## 2013-07-18 MED ORDER — MORPHINE SULFATE (PF) 0.5 MG/ML IJ SOLN
INTRAMUSCULAR | Status: DC | PRN
Start: 2013-07-18 — End: 2013-07-18
  Administered 2013-07-18: .5 mg via EPIDURAL
  Administered 2013-07-18: 1 mg via EPIDURAL

## 2013-07-18 MED ORDER — OXYTOCIN 40 UNITS IN LACTATED RINGERS INFUSION - SIMPLE MED: 62.5000 mL/h | INTRAVENOUS | Status: AC

## 2013-07-18 MED ORDER — LANOLIN HYDROUS EX OINT: 1.0000 "application " | TOPICAL_OINTMENT | CUTANEOUS | Status: DC | PRN

## 2013-07-18 MED ORDER — FENTANYL CITRATE 0.05 MG/ML IJ SOLN
INTRAMUSCULAR | Status: DC | PRN
  Administered 2013-07-18: 15 ug via INTRATHECAL

## 2013-07-18 MED ORDER — FLEET ENEMA 7-19 GM/118ML RE ENEM: 1.0000 | ENEMA | Freq: Every day | RECTAL | Status: DC | PRN

## 2013-07-18 MED ORDER — SCOPOLAMINE 1 MG/3DAYS TD PT72
1.0000 | MEDICATED_PATCH | Freq: Once | TRANSDERMAL | Status: DC
  Administered 2013-07-18: 1.5 mg via TRANSDERMAL

## 2013-07-18 MED ORDER — ONDANSETRON HCL 4 MG/2ML IJ SOLN
INTRAMUSCULAR | Status: AC
  Filled 2013-07-18: qty 2

## 2013-07-18 MED ORDER — DIPHENHYDRAMINE HCL 25 MG PO CAPS: 25.0000 mg | ORAL_CAPSULE | ORAL | Status: DC | PRN

## 2013-07-18 MED ORDER — PHENYLEPHRINE 8 MG IN D5W 100 ML (0.08MG/ML) PREMIX OPTIME
INJECTION | INTRAVENOUS | Status: DC | PRN
  Administered 2013-07-18: 40 ug/min via INTRAVENOUS

## 2013-07-18 MED ORDER — WITCH HAZEL-GLYCERIN EX PADS: 1.0000 "application " | MEDICATED_PAD | CUTANEOUS | Status: DC | PRN

## 2013-07-18 MED ORDER — FAMOTIDINE IN NACL 20-0.9 MG/50ML-% IV SOLN
20.0000 mg | Freq: Once | INTRAVENOUS | Status: AC
  Administered 2013-07-18: 20 mg via INTRAVENOUS
  Filled 2013-07-18: qty 50

## 2013-07-18 MED ORDER — DIBUCAINE 1 % RE OINT: 1.0000 "application " | TOPICAL_OINTMENT | RECTAL | Status: DC | PRN

## 2013-07-18 MED ORDER — CITRIC ACID-SODIUM CITRATE 334-500 MG/5ML PO SOLN
30.0000 mL | Freq: Once | ORAL | Status: AC
Start: 2013-07-18 — End: 2013-07-18
  Administered 2013-07-18: 30 mL via ORAL
  Filled 2013-07-18: qty 15

## 2013-07-18 MED ORDER — BISACODYL 10 MG RE SUPP: 10.0000 mg | Freq: Every day | RECTAL | Status: DC | PRN

## 2013-07-18 MED ORDER — ZOLPIDEM TARTRATE 5 MG PO TABS: 5.0000 mg | ORAL_TABLET | Freq: Every evening | ORAL | Status: DC | PRN

## 2013-07-18 MED ORDER — ONDANSETRON HCL 4 MG PO TABS: 4.0000 mg | ORAL_TABLET | ORAL | Status: DC | PRN

## 2013-07-18 MED ORDER — NALOXONE HCL 0.4 MG/ML IJ SOLN: 0.4000 mg | INTRAMUSCULAR | Status: DC | PRN

## 2013-07-18 MED ORDER — ONDANSETRON HCL 4 MG/2ML IJ SOLN: 4.0000 mg | Freq: Three times a day (TID) | INTRAMUSCULAR | Status: DC | PRN

## 2013-07-18 MED ORDER — ONDANSETRON HCL 4 MG/2ML IJ SOLN
INTRAMUSCULAR | Status: DC | PRN
  Administered 2013-07-18: 4 mg via INTRAVENOUS

## 2013-07-18 MED ORDER — BUPIVACAINE HCL (PF) 0.25 % IJ SOLN
INTRAMUSCULAR | Status: DC | PRN
  Administered 2013-07-18: 30 mL

## 2013-07-18 MED ORDER — CHLOROPROCAINE HCL 3 % IJ SOLN
INTRAMUSCULAR | Status: AC
  Filled 2013-07-18: qty 20

## 2013-07-18 MED ORDER — SCOPOLAMINE 1 MG/3DAYS TD PT72
MEDICATED_PATCH | TRANSDERMAL | Status: AC
  Administered 2013-07-18: 1.5 mg via TRANSDERMAL
  Filled 2013-07-18: qty 1

## 2013-07-18 MED ORDER — CHLOROPROCAINE HCL 3 % IJ SOLN
INTRAMUSCULAR | Status: DC | PRN
  Administered 2013-07-18: 20 mL

## 2013-07-18 MED ORDER — BUPIVACAINE-EPINEPHRINE (PF) 0.25% -1:200000 IJ SOLN
INTRAMUSCULAR | Status: AC
  Filled 2013-07-18: qty 30

## 2013-07-18 MED ORDER — OXYTOCIN 10 UNIT/ML IJ SOLN
INTRAMUSCULAR | Status: AC
  Filled 2013-07-18: qty 4

## 2013-07-18 MED ORDER — MEPERIDINE HCL 25 MG/ML IJ SOLN
INTRAMUSCULAR | Status: AC
  Filled 2013-07-18: qty 1

## 2013-07-18 MED ORDER — OXYCODONE-ACETAMINOPHEN 5-325 MG PO TABS
1.0000 | ORAL_TABLET | ORAL | Status: DC | PRN
  Administered 2013-07-19 – 2013-07-20 (×5): 1 via ORAL
  Filled 2013-07-18 (×3): qty 1
  Filled 2013-07-18: qty 2
  Filled 2013-07-18: qty 1

## 2013-07-18 MED ORDER — DEXTROSE 5 % IV SOLN: 1.0000 ug/kg/h | INTRAVENOUS | Status: DC | PRN

## 2013-07-18 MED ORDER — FENTANYL CITRATE 0.05 MG/ML IJ SOLN
INTRAMUSCULAR | Status: DC | PRN
  Administered 2013-07-18: 85 ug via INTRAVENOUS

## 2013-07-18 MED ORDER — MORPHINE SULFATE 0.5 MG/ML IJ SOLN
INTRAMUSCULAR | Status: AC
  Filled 2013-07-18: qty 10

## 2013-07-18 MED ORDER — METOCLOPRAMIDE HCL 5 MG/ML IJ SOLN: 10.0000 mg | Freq: Three times a day (TID) | INTRAMUSCULAR | Status: DC | PRN

## 2013-07-18 MED ORDER — SODIUM CHLORIDE 0.9 % IJ SOLN: 3.0000 mL | INTRAMUSCULAR | Status: DC | PRN

## 2013-07-18 MED ORDER — ONDANSETRON HCL 4 MG/2ML IJ SOLN: 4.0000 mg | INTRAMUSCULAR | Status: DC | PRN

## 2013-07-18 MED ORDER — MEPERIDINE HCL 25 MG/ML IJ SOLN
INTRAMUSCULAR | Status: DC | PRN
  Administered 2013-07-18 (×2): 12.5 mg via INTRAVENOUS

## 2013-07-18 MED ORDER — BUPIVACAINE IN DEXTROSE 0.75-8.25 % IT SOLN
INTRATHECAL | Status: DC | PRN
  Administered 2013-07-18: 1.4 mL via INTRATHECAL

## 2013-07-18 MED ORDER — SIMETHICONE 80 MG PO CHEW
80.0000 mg | CHEWABLE_TABLET | ORAL | Status: DC | PRN
  Administered 2013-07-20: 80 mg via ORAL

## 2013-07-18 MED ORDER — LACTATED RINGERS IV SOLN
INTRAVENOUS | Status: DC
  Administered 2013-07-18 (×2): via INTRAVENOUS

## 2013-07-18 MED ORDER — MORPHINE SULFATE (PF) 0.5 MG/ML IJ SOLN
INTRAMUSCULAR | Status: DC | PRN
Start: 2013-07-18 — End: 2013-07-18
  Administered 2013-07-18: .1 mg via INTRATHECAL

## 2013-07-18 MED ORDER — IBUPROFEN 600 MG PO TABS
600.0000 mg | ORAL_TABLET | Freq: Four times a day (QID) | ORAL | Status: DC
  Administered 2013-07-18 – 2013-07-21 (×12): 600 mg via ORAL
  Filled 2013-07-18 (×12): qty 1

## 2013-07-18 MED ORDER — CEFAZOLIN (ANCEF) 1 G IV SOLR
2.0000 g | INTRAVENOUS | Status: AC
  Administered 2013-07-18: 2 g
  Filled 2013-07-18: qty 2

## 2013-07-18 MED ORDER — METOCLOPRAMIDE HCL 5 MG/ML IJ SOLN: 10.0000 mg | Freq: Once | INTRAMUSCULAR | Status: DC | PRN

## 2013-07-18 MED ORDER — BUPIVACAINE HCL (PF) 0.25 % IJ SOLN
INTRAMUSCULAR | Status: AC
  Filled 2013-07-18: qty 30

## 2013-07-18 MED ORDER — KETOROLAC TROMETHAMINE 60 MG/2ML IM SOLN
60.0000 mg | Freq: Once | INTRAMUSCULAR | Status: DC | PRN
Start: 2013-07-18 — End: 2013-07-18
  Filled 2013-07-18: qty 2

## 2013-07-18 SURGICAL SUPPLY — 30 items
BARRIER ADHS 3X4 INTERCEED (GAUZE/BANDAGES/DRESSINGS) IMPLANT
BENZOIN TINCTURE PRP APPL 2/3 (GAUZE/BANDAGES/DRESSINGS) ×3 IMPLANT
CLAMP CORD UMBIL (MISCELLANEOUS) IMPLANT
CLOSURE WOUND 1/2 X4 (GAUZE/BANDAGES/DRESSINGS) ×1
CLOTH BEACON ORANGE TIMEOUT ST (SAFETY) ×3 IMPLANT
DRAPE LG THREE QUARTER DISP (DRAPES) IMPLANT
DRSG OPSITE POSTOP 4X10 (GAUZE/BANDAGES/DRESSINGS) ×3 IMPLANT
DURAPREP 26ML APPLICATOR (WOUND CARE) ×3 IMPLANT
ELECT REM PT RETURN 9FT ADLT (ELECTROSURGICAL) ×3
ELECTRODE REM PT RTRN 9FT ADLT (ELECTROSURGICAL) ×1 IMPLANT
EXTRACTOR VACUUM KIWI (MISCELLANEOUS) IMPLANT
GLOVE BIO SURGEON STRL SZ 6.5 (GLOVE) ×2 IMPLANT
GLOVE BIO SURGEONS STRL SZ 6.5 (GLOVE) ×1
GLOVE BIOGEL PI IND STRL 7.0 (GLOVE) ×1 IMPLANT
GLOVE BIOGEL PI INDICATOR 7.0 (GLOVE) ×2
GOWN STRL REUS W/TWL LRG LVL3 (GOWN DISPOSABLE) ×6 IMPLANT
KIT ABG SYR 3ML LUER SLIP (SYRINGE) IMPLANT
NEEDLE HYPO 25X5/8 SAFETYGLIDE (NEEDLE) IMPLANT
NS IRRIG 1000ML POUR BTL (IV SOLUTION) ×3 IMPLANT
PACK C SECTION WH (CUSTOM PROCEDURE TRAY) ×3 IMPLANT
PAD OB MATERNITY 4.3X12.25 (PERSONAL CARE ITEMS) ×3 IMPLANT
STRIP CLOSURE SKIN 1/2X4 (GAUZE/BANDAGES/DRESSINGS) ×2 IMPLANT
SUT VIC AB 0 CT1 36 (SUTURE) ×18 IMPLANT
SUT VIC AB 2-0 CT1 27 (SUTURE) ×2
SUT VIC AB 2-0 CT1 TAPERPNT 27 (SUTURE) ×1 IMPLANT
SUT VIC AB 4-0 KS 27 (SUTURE) ×3 IMPLANT
SUT VIC AB 4-0 PS2 27 (SUTURE) ×3 IMPLANT
TOWEL OR 17X24 6PK STRL BLUE (TOWEL DISPOSABLE) ×3 IMPLANT
TRAY FOLEY CATH 14FR (SET/KITS/TRAYS/PACK) ×3 IMPLANT
WATER STERILE IRR 1000ML POUR (IV SOLUTION) IMPLANT

## 2013-07-18 NOTE — MAU Note (Addendum)
Began leaking around 1250am. Pt. Felt some contractions on her way to the hospital. Was sleeping and got up to use restroom and saw some blood when she wiped. Pt. Went to restroom at that time then went back to bed and at 110am felt a gush of fluid and it is clear fluid. Pt. Wore pad in and it soaked through her pants. Pt. Feels irregular contractions now. Baby has been moving well. Denies any blood now. Pt. Saw OB yesterday. Did not have her cervix checked at that time. Pt. States she has high BP and has been having NST biweekly and ultrasounds once a week (BPP) to check the fluid. Is scheduled for a growth ultrasound at 37.4 weeks.

## 2013-07-18 NOTE — Lactation Note (Signed)
This note was copied from the chart of Girl Assunta GamblesJessica Gaccione. Lactation Consultation Note  Patient Name: Girl Assunta GamblesJessica Inocencio ZOXWR'UToday's Date: 07/18/2013 Reason for consult: Initial assessment;Late preterm infant Baby 13 hours of life. Baby very red, STS with mom, fussy when moved, but redness/ruddiness reduces when removed from mom's gown. Mom able to hand express colostrum. She reports that she has been dripping all day, but baby has not wanted latch since right after birth. Assisted mom to latch baby, but baby not interested. Performed suck training with a gloved finger. Baby started by chomping down on finger, but effectively sucking after a few minutes. Baby pursed lips together and fell asleep while attempting to latch. Baby did receive a few drops of colostrum. Enc mom to offer lots of STS and offer breasts with cues, approximately 8-12 times per day. Enc mom to call out for assistance as needed. Mom given [facility] brochure and aware of OP/BFSG services and community resources.   Maternal Data    Feeding Feeding Type: Breast Fed Length of feed: 0 min  LATCH Score/Interventions Latch: Too sleepy or reluctant, no latch achieved, no sucking elicited. Intervention(s): Skin to skin;Waking techniques Intervention(s): Adjust position;Assist with latch;Breast compression  Audible Swallowing: None Intervention(s): Skin to skin Intervention(s): Skin to skin  Type of Nipple: Everted at rest and after stimulation  Comfort (Breast/Nipple): Soft / non-tender     Hold (Positioning): Assistance needed to correctly position infant at breast and maintain latch.  LATCH Score: 5  Lactation Tools Discussed/Used     Consult Status Consult Status: Follow-up Follow-up type: In-patient    Sherlyn HayJennifer D Almyra Birman 07/18/2013, 7:22 PM

## 2013-07-18 NOTE — H&P (Signed)
I agree with the note  Adam PhenixJames G Schyler Counsell, MD

## 2013-07-18 NOTE — Transfer of Care (Signed)
Immediate Anesthesia Transfer of Care Note  Patient: Victoria HumblesJessica L Lin  Procedure(s) Performed: Procedure(s): CESAREAN SECTION (N/A)  Patient Location: PACU  Anesthesia Type:Spinal  Level of Consciousness: awake, alert  and oriented  Airway & Oxygen Therapy: Patient Spontanous Breathing  Post-op Assessment: Report given to PACU RN and Post -op Vital signs reviewed and stable  Post vital signs: Reviewed and stable  Complications: No apparent anesthesia complications

## 2013-07-18 NOTE — Op Note (Signed)
Victoria HumblesJessica L Lin PROCEDURE DATE: 07/18/2013  PREOPERATIVE DIAGNOSES: Intrauterine pregnancy at  10131w2d weeks gestation; malpresentation: Breech  POSTOPERATIVE DIAGNOSES: The same  PROCEDURE: Primary Low Transverse Cesarean Section  SURGEON:  Dr. Debroah LoopArnold  ASSISTANT:  Dr. Ike Benedom  INDICATIONS: Victoria HumblesJessica L Lin is a 28 y.o. G1P0000 at 5831w2d here for cesarean section secondary to the indications listed under preoperative diagnoses; please see preoperative note for further details.  The risks of cesarean section were discussed with the patient including but were not limited to: bleeding which may require transfusion or reoperation; infection which may require antibiotics; injury to bowel, bladder, ureters or other surrounding organs; injury to the fetus; need for additional procedures including hysterectomy in the event of a life-threatening hemorrhage; placental abnormalities wth subsequent pregnancies, incisional problems, thromboembolic phenomenon and other postoperative/anesthesia complications.   The patient concurred with the proposed plan, giving informed written consent for the procedure.    FINDINGS:  Viable female infant in Breech presentation.  Apgars 8 and 9.  Clear amniotic fluid.  Intact placenta, three vessel cord.  Normal uterus, fallopian tubes and ovaries bilaterally.  ANESTHESIA: Spinal INTRAVENOUS FLUIDS: 1500 ml ESTIMATED BLOOD LOSS: 700 ml URINE OUTPUT:  350 ml SPECIMENS: Placenta sent to L&D COMPLICATIONS: None immediate  PROCEDURE IN DETAIL:  The patient preoperatively received intravenous antibiotics and had sequential compression devices applied to her lower extremities. She was then taken to the operating room where spinal anesthesia was administered and she was then placed in a dorsal supine position with a leftward tilt, and a foley catheter was placed into her bladder and attached to constant gravity. She was prepped and draped in a sterile manner  and placed in trendelenberg  to aid in pain control. 30 ml of 0.25% Marcaine was injected subcutaneously around the incision site and patient was adequate. After an adequate timeout was performed, a Pfannenstiel skin incision was made with scalpel and carried through to the underlying layer of fascia. The fascia was incised in the midline, and this incision was extended bilaterally using the Mayo scissors.  Kocher clamps were applied to the superior aspect of the fascial incision and the underlying rectus muscles were dissected off bluntly. A similar process was carried out on the inferior aspect of the fascial incision. The rectus muscles were separated in the midline bluntly and the peritoneum was entered bluntly. [name] was placed for visualization and local anesthetic bathed over uterus. Attention was turned to the lower uterine segment where a bladder flap was created and alow transverse hysterotomy was made with a scalpel and extended bilaterally bluntly.  The infant was successfully delivered breech, the cord was clamped and cut and the infant was handed over to awaiting neonatology team. Uterine massage was then administered, and the placenta delivered intact with a three-vessel cord. The uterus was then cleared of clot and debris.  The hysterotomy was closed with 0 Vicryl in a running locked fashion including an extension on the right of hysterotomy and an imbricating layer was also placed with 0 Vicryl. Several figure of 8s required for hemostasis. The pelvis was cleared of all clot and debris. Hemostasis was confirmed on all surfaces.  The peritoneum and the muscles were reapproximated using 0 Vicryl interrupted stitch. The fascia was then closed using 0 Vicryl in a running fashion.  The subcutaneous layer was irrigated.  The skin was closed with a 4-0 Vicryl subcuticular stitch. The patient tolerated the procedure well. Sponge, lap, instrument and needle counts were correct x 2.  She  was taken to the recovery room in stable  condition.   Tawana ScaleMichael Ryan Froylan Hobby, MD OB Fellow

## 2013-07-18 NOTE — Op Note (Signed)
Present throughout the procedure Victoria PhenixJames G [name], MD

## 2013-07-18 NOTE — Anesthesia Procedure Notes (Signed)
Spinal  Patient location during procedure: OR Start time: 07/18/2013 5:13 AM Staffing Performed by: anesthesiologist  Preanesthetic Checklist Completed: patient identified, site marked, surgical consent, pre-op evaluation, timeout performed, IV checked, risks and benefits discussed and monitors and equipment checked Spinal Block Patient position: sitting Prep: site prepped and draped and DuraPrep Patient monitoring: continuous pulse ox, blood pressure and heart rate Approach: midline Location: L3-4 Injection technique: single-shot Needle Needle type: Pencan  Needle gauge: 24 G Needle length: 10 cm Assessment Sensory level: T4 Additional Notes Clear free flow CSF on first attempt.  No paresthesia.  Patient tolerated procedure well with no apparent complications.  Jasmine DecemberA. Cassidy MD

## 2013-07-18 NOTE — Anesthesia Preprocedure Evaluation (Signed)
Anesthesia Evaluation  Patient identified by MRN, date of birth, ID band Patient awake    Reviewed: Allergy & Precautions, H&P , NPO status , Patient's Chart, lab work & pertinent test results, reviewed documented beta blocker date and time   History of Anesthesia Complications Negative for: history of anesthetic complications  Airway Mallampati: II TM Distance: >3 FB Neck ROM: full    Dental  (+) Teeth Intact   Pulmonary neg pulmonary ROS,  breath sounds clear to auscultation        Cardiovascular hypertension (GHTN), Rhythm:regular Rate:Normal     Neuro/Psych  Headaches, negative psych ROS   GI/Hepatic Neg liver ROS, IBS   Endo/Other  negative endocrine ROS  Renal/GU negative Renal ROS  negative genitourinary   Musculoskeletal   Abdominal   Peds  Hematology negative hematology ROS (+)   Anesthesia Other Findings Teddy grahams at 10:30 pm, otherwise NPO  Reproductive/Obstetrics (+) Pregnancy (breech, SROM, in labor)                           Anesthesia Physical Anesthesia Plan  ASA: II and emergent  Anesthesia Plan: Spinal   Post-op Pain Management:    Induction:   Airway Management Planned:   Additional Equipment:   Intra-op Plan:   Post-operative Plan:   Informed Consent: I have reviewed the patients History and Physical, chart, labs and discussed the procedure including the risks, benefits and alternatives for the proposed anesthesia with the patient or authorized representative who has indicated his/her understanding and acceptance.     Plan Discussed with: Surgeon and CRNA  Anesthesia Plan Comments:         Anesthesia Quick Evaluation

## 2013-07-18 NOTE — Anesthesia Postprocedure Evaluation (Signed)
  Anesthesia Post-op Note  Patient: Victoria Lin  Procedure(s) Performed: Procedure(s): CESAREAN SECTION (N/A)  Patient Location: PACU  Anesthesia Type:Spinal  Level of Consciousness: awake, alert  and oriented  Airway and Oxygen Therapy: Patient Spontanous Breathing  Post-op Pain: none  Post-op Assessment: Post-op Vital signs reviewed, Patient's Cardiovascular Status Stable, Respiratory Function Stable, Patent Airway, No signs of Nausea or vomiting, Pain level controlled, No headache and No backache  Post-op Vital Signs: Reviewed and stable  Last Vitals:  Filed Vitals:   07/18/13 0715  BP: 119/68  Pulse: 68  Temp:   Resp: 15    Complications: No apparent anesthesia complications

## 2013-07-18 NOTE — H&P (Signed)
First Provider Initiated Contact with Patient 07/18/13 [phone]      Chief Complaint:  ?Rupture of membranes  Victoria Lin is  28 y.o. G1P0000 with IUP at 2569w2d presents complaining of a gush of fluid around 12:50 AM with some spotting.  She has been having regular contractions, pain is 1/10. Positive Fetal movement. [facility] 6823[facility]5d.pregenancy has been complicated by some elevated blood pressure up to 140s since first trimester.  She has not required medication therapy.  Biweekly NST have been wnl, nml fluid but breech presentation on US. Last meal at 2200 - reports teddy grahams  Of note most recent Ultrasound on 07/06/13 reported frank breech.  Obstetrical/Gynecological History: OB History   Grav Para Term Preterm Abortions TAB SAB Ect Mult Living   1 0 0 0 0 0 0 0 0 0      Past Medical History: Past Medical History  Diagnosis Date  . IBS (irritable bowel syndrome)   . Heart murmur     H/O as a child    Past Surgical History: Past Surgical History  Procedure Laterality Date  . Wisdom tooth extraction      Family History: Family History  Problem Relation Age of Onset  . Rheum arthritis Father   . Heart attack Father   . Lupus Father   . Cancer Father     skin    Social History: History  Substance Use Topics  . Smoking status: Never Smoker   . Smokeless tobacco: Never Used  . Alcohol Use: No    Allergies: No Known Allergies  Meds:  Prescriptions prior to admission  Medication Sig Dispense Refill  . Prenatal-FE Bis-FA-DHA w/o A (COMPLETE PRENATAL/DHA) 30-0.975 & 300 MG MISC Take 1 tablet by mouth daily.  90 each  5    Review of Systems -   Review of Systems  Constitutional: No fever or chills HEENT: No headache, no vision changes CV: no chest pain Resp: No dyspnea Ab: ctx as described above Ext: No pedal edema    Physical Exam  Blood pressure 136/78, pulse 77, temperature 98.3 F (36.8 C), temperature source Oral, resp. rate 16,  last menstrual period 11/13/2012. GENERAL: Well-developed, well-nourished female in no acute distress.  LUNGS: Clear to auscultation bilaterally.  HEART: Regular rate and rhythm. ABDOMEN: Soft, nontender, nondistended, gravid.  EXTREMITIES: Nontender, no edema, 2+ distal pulses. SPECULUM EXAM: Pooling CERVICAL EXAM: 1cm/thick/posterior  Presentation: Transverse - confirmed by bedside Ultrasound FHT:  Baseline 160s, +Accels, Moderate Variability, No decels. Category I tracing Contractions: Every 4-5 mins   Labs: Microscopy + Ferning  Imaging Studies:   Bedside US: Transverse  [place] Ob Follow Up  07/06/2013   OBSTETRICAL ULTRASOUND: This exam was performed within a [place] [facility]ound Department. The OB US report was generated in the AS system, and faxed to the ordering physician.   This report is also available in [facility] [employer] and in the [employer] [employer] PACS. See AS Obstetric US report.  [place] Fetal Bpp W/o Non Stress  07/06/2013   OBSTETRICAL ULTRASOUND: This exam was performed within a [place] [facility] Department. The OB US report was generated in the AS system, and faxed to the ordering physician.   This report is also available in [facility] [employer] and in the [employer] PACS. See AS Obstetric US report.   Assessment: Victoria HumblesJessica L Lin is  28 y.o. G1P0000 with IUP at 7869w2d presents with ROM confirmed by pooling and ferning on microscopy, + CTX, baby is breech  confirmed by bedside US. Category I FHT  -PROM -Preterm labor -Breech  Plan: 1. Cesarean section for malpresentation (transverse) - ancef 2g - SCDs - consented for c-section - discussed with Anesthesia 6hr adequate pre op - to OR for c-section  2. GHTN: BP WNL at time of evaluation. Will monitor PP. No Preeclampsia symptoms.  Myriam JacobsonRobyn H Restrepo 5/5/20154:07 AM  The risks of cesarean section were discussed with the patient including but were not limited to: bleeding which may require  transfusion or reoperation; infection which may require antibiotics; injury to bowel, bladder, ureters or other surrounding organs; injury to the fetus; need for additional procedures including hysterectomy in the event of a life-threatening hemorrhage; placental abnormalities wth subsequent pregnancies, incisional problems, thromboembolic phenomenon and other postoperative/anesthesia complications.  The patient concurred with the proposed plan, giving informed written consent for the procedures.  Patient has been NPO since 2200 she will remain NPO for procedure. Anesthesia and OR aware.  Preoperative prophylactic antibiotics and SCDs ordered on call to the OR.  To OR when ready.  I spoke with and examined patient and agree with resident's note and plan of care.  Tawana ScaleMichael Ryan Hanish Laraia, MD OB Fellow 07/18/2013 4:36 AM

## 2013-07-18 NOTE — MAU Provider Note (Signed)
First Provider Initiated Contact with Patient 07/18/13 [phone]      Chief Complaint:  ?Rupture of membranes  Victoria Lin is  28 y.o. G1P0000 with IUP at 4815w2d presents complaining of a gush of fluid around 12:50 AM with some spotting.  She has been having regular contractions, pain is 1/10. Positive Fetal movement. [facility] 8149w5d.pregenancy has been complicated by some elevated blood pressure up to 140s since first trimester.  She has not required medication therapy.  Biweekly NST have been wnl.  Of note most recent Ultrasound on 07/06/13 reported frank breech.  Obstetrical/Gynecological History: OB History   Grav Para Term Preterm Abortions TAB SAB Ect Mult Living   1 0 0 0 0 0 0 0 0 0      Past Medical History: Past Medical History  Diagnosis Date  . IBS (irritable bowel syndrome)   . Heart murmur     H/O as a child    Past Surgical History: Past Surgical History  Procedure Laterality Date  . Wisdom tooth extraction      Family History: Family History  Problem Relation Age of Onset  . Rheum arthritis Father   . Heart attack Father   . Lupus Father   . Cancer Father     skin    Social History: History  Substance Use Topics  . Smoking status: Never Smoker   . Smokeless tobacco: Never Used  . Alcohol Use: No    Allergies: No Known Allergies  Meds:  Prescriptions prior to admission  Medication Sig Dispense Refill  . Prenatal-FE Bis-FA-DHA w/o A (COMPLETE PRENATAL/DHA) 30-0.975 & 300 MG MISC Take 1 tablet by mouth daily.  90 each  5    Review of Systems -   Review of Systems  Constitutional: No fever or chills HEENT: No headache, no vision changes CV: no chest pain Resp: No dyspnea Ab: ctx as described above Ext: No pedal edema    Physical Exam  Blood pressure 136/78, pulse 77, temperature 98.3 F (36.8 C), temperature source Oral, resp. rate 16, last menstrual period 11/13/2012. GENERAL: Well-developed, well-nourished female  in no acute distress.  LUNGS: Clear to auscultation bilaterally.  HEART: Regular rate and rhythm. ABDOMEN: Soft, nontender, nondistended, gravid.  EXTREMITIES: Nontender, no edema, 2+ distal pulses. SPECULUM EXAM: Pooling CERVICAL EXAM: 1cm/thick/posterior  Presentation: breech confirmed by bedside Ultrasound FHT:  Baseline 160s, +Accels, Moderate Variability, No decels. Category I tracing Contractions: Every 4-5 mins   Labs: Microscopy + Ferning  Imaging Studies:   Bedside US: Transverse  [place] Ob Follow Up  07/06/2013   OBSTETRICAL ULTRASOUND: This exam was performed within a [place] Ultrasound Department. The OB US report was generated in the AS system, and faxed to the ordering physician.   This report is also available in [facility] [employer] and in the [employer] PACS. See AS Obstetric US report.  [place] Fetal Bpp W/o Non Stress  07/06/2013   OBSTETRICAL ULTRASOUND: This exam was performed within a [place] Ultrasound Department. The OB US report was generated in the AS system, and faxed to the ordering physician.   This report is also available in [facility] [employer] and in the [facility] [employer] PACS. See AS Obstetric US report.   Assessment: Victoria Lin is  28 y.o. G1P0000 with IUP at 7215w2d presents with ROM confirmed by pooling and ferning on microscopy, + CTX, baby is breech confirmed by bedside US. Category I FHT  -PROM -Preterm labor -Breech  Plan: 1. Cesarean  section for Breech baby  Myriam JacobsonRobyn H Idelia Caudell 5/5/20154:07 AM

## 2013-07-18 NOTE — MAU Provider Note (Signed)
Agree with admission and plan Adam PhenixJames G [name], MD

## 2013-07-19 ENCOUNTER — Encounter (HOSPITAL_COMMUNITY): Payer: Self-pay | Admitting: Obstetrics & Gynecology

## 2013-07-19 LAB — CBC
HEMATOCRIT: 27.9 % — AB (ref 36.0–46.0)
Hemoglobin: 9.3 g/dL — ABNORMAL LOW (ref 12.0–15.0)
MCH: 30.9 pg (ref 26.0–34.0)
MCHC: 33.3 g/dL (ref 30.0–36.0)
MCV: 92.7 fL (ref 78.0–100.0)
PLATELETS: 152 10*3/uL (ref 150–400)
RBC: 3.01 MIL/uL — AB (ref 3.87–5.11)
RDW: 13.2 % (ref 11.5–15.5)
WBC: 12.3 10*3/uL — AB (ref 4.0–10.5)

## 2013-07-19 NOTE — Addendum Note (Signed)
Addendum created 07/19/13 0747 by Algis GreenhouseLinda A Annie Saephan, CRNA   Modules edited: Notes Section   Notes Section:  File: [id number]

## 2013-07-19 NOTE — Progress Notes (Signed)
Subjective: Postpartum Day 2: Cesarean Delivery Patient reports tolerating PO, + flatus and no problems voiding.    Objective: Vital signs in last 24 hours: Temp:  [98 F (36.7 C)-98.9 F (37.2 C)] 98 F (36.7 C) (05/06 0532) Pulse Rate:  [46-103] 50 (05/06 0532) Resp:  [16-20] 18 (05/06 0532) BP: (92-122)/(44-85) 102/58 mmHg (05/06 0532) SpO2:  [96 %-98 %] 96 % (05/06 0532) Weight:  [75.297 kg (166 lb)] 75.297 kg (166 lb) (05/05 0908)  Physical Exam:  General: alert, cooperative and no distress Lochia: appropriate Uterine Fundus: firm Incision: no significant drainage DVT Evaluation: No evidence of DVT seen on physical exam. Negative Homan's sign. No cords or calf tenderness. No significant calf/ankle edema.   Recent Labs  07/18/13 0415 07/19/13 0538  HGB 12.9 9.3*  HCT 37.8 27.9*    Assessment/Plan: Status post Cesarean section. Doing well postoperatively.  Continue current care. Breastfeeding  Misty StanleyLisa A Leftwich-Kirby 07/19/2013, 8:16 AM

## 2013-07-19 NOTE — Anesthesia Postprocedure Evaluation (Signed)
Anesthesia Post Note  Patient: Victoria Lin  Procedure(s) Performed: Procedure(s) (LRB): CESAREAN SECTION (N/A)  Anesthesia type: Spinal  Patient location: Mother/Baby  Post pain: Pain level controlled  Post assessment: Post-op Vital signs reviewed  Last Vitals:  Filed Vitals:   07/19/13 0532  BP: 102/58  Pulse: 50  Temp: 36.7 C  Resp: 18    Post vital signs: Reviewed  Level of consciousness: awake  Complications: No apparent anesthesia complications

## 2013-07-19 NOTE — Lactation Note (Signed)
This note was copied from the chart of Victoria Assunta GamblesJessica Demarest. Lactation Consultation Note Follow up visit at 36 hours with 4 good feedings in the past 24 hours.  Mom has bruising on nipples and last used comfort gels early this morning.  Encouraged EBM to nipples and mom is able to return demonstration.  Baby is asleep on FOB, encouraged to wake baby every 3 hours and feed with early feeding cues on demand.  Discussed need for 10-12 good feedings in the next 24 hours.  Mom has full semi compressible breast.  Assisted in football hold on right breast.  Baby is able to establish a deep wide latch for 10minutes and swallows are heard.  Baby came off and burped and rooted to latch again for another 5 minutes.   DEBP set up to protect mom's milk supply.  Mom to pump after feedings and before if breast not compressible for latch.  Mom to call for assist if baby misses a feeding.  Encouraged to continue to use comfort gels.      Patient Name: Victoria Assunta GamblesJessica Goren GNFAO'ZToday's Date: 07/19/2013 Reason for consult: Follow-up assessment;Late preterm infant   Maternal Data Has patient been taught Hand Expression?: Yes Does the patient have breastfeeding experience prior to this delivery?: No  Feeding Feeding Type: Breast Fed Length of feed: 20 min  LATCH Score/Interventions Latch: Grasps breast easily, tongue down, lips flanged, rhythmical sucking. Intervention(s): Skin to skin;Teach feeding cues;Waking techniques Intervention(s): Breast compression;Assist with latch;Adjust position  Audible Swallowing: Spontaneous and intermittent Intervention(s): Skin to skin;Hand expression  Type of Nipple: Everted at rest and after stimulation  Comfort (Breast/Nipple): Filling, red/small blisters or bruises, mild/mod discomfort  Problem noted: Mild/Moderate discomfort;Cracked, bleeding, blisters, bruises Interventions (Mild/moderate discomfort): Hand expression;Comfort gels  Hold (Positioning): Assistance needed to  correctly position infant at breast and maintain latch. Intervention(s): Breastfeeding basics reviewed;Support Pillows;Position options;Skin to skin  LATCH Score: 8  Lactation Tools Discussed/Used Tools: Pump;Comfort gels Breast pump type: Double-Electric Breast Pump Pump Review: Setup, frequency, and cleaning Initiated by:: JS Date initiated:: 07/19/13   Consult Status Consult Status: Follow-up Date: 07/20/13 Follow-up type: In-patient    Arvella MerlesJana Lynn Jearlene Bridwell 07/19/2013, 6:04 PM

## 2013-07-20 ENCOUNTER — Other Ambulatory Visit: Payer: BC Managed Care – PPO

## 2013-07-20 NOTE — Progress Notes (Signed)
Subjective: Postpartum Day 3: Cesarean Delivery Patient reports incisional pain.    Objective: Vital signs in last 24 hours: Temp:  [97.5 F (36.4 C)-98.7 F (37.1 C)] 98.7 F (37.1 C) (05/07 0615) Pulse Rate:  [69-76] 76 (05/07 0615) Resp:  [16-18] 16 (05/07 0615) BP: (114-115)/(71-73) 114/73 mmHg (05/07 0615)  Physical Exam:  General: alert, cooperative and appears stated age Lochia: appropriate Uterine Fundus: firm Incision: healing well, no significant drainage, no dehiscence, no significant erythema DVT Evaluation: No evidence of DVT seen on physical exam. Negative Homan's sign. No cords or calf tenderness. No significant calf/ankle edema.   Recent Labs  07/18/13 0415 07/19/13 0538  HGB 12.9 9.3*  HCT 37.8 27.9*    Assessment/Plan: Status post Cesarean section. Doing well postoperatively.  Continue current care as pediatrics would like to keep baby in hospital 1-2 more days for prematurity.  Francis Doenges 07/20/2013, 7:51 AM

## 2013-07-20 NOTE — Progress Notes (Signed)
Pt reported blisters on the left lower side of her honeycomb dressing. Two small blisters found. Dressing trimmed to uncover blisters. Instructed pt to report to nurse any new findings or changes in condition of skin.

## 2013-07-21 ENCOUNTER — Encounter (HOSPITAL_COMMUNITY)
Admission: RE | Admit: 2013-07-21 | Discharge: 2013-07-21 | Disposition: A | Discharge status: Home / Self Care | Payer: BC Managed Care – PPO | Source: Ambulatory Visit | Attending: Obstetrics & Gynecology | Admitting: Obstetrics & Gynecology

## 2013-07-21 DIAGNOSIS — O923 Agalactia: Secondary | ICD-10-CM | POA: Insufficient documentation

## 2013-07-21 MED ORDER — IBUPROFEN 600 MG PO TABS: 600.0000 mg | ORAL_TABLET | Freq: Four times a day (QID) | ORAL | Status: DC

## 2013-07-21 NOTE — Discharge Instructions (Signed)
Postpartum Care After Cesarean Delivery °After you deliver your newborn (postpartum period), the usual stay in the hospital is 24 72 hours. If there were problems with your labor or delivery, or if you have other medical problems, you might be in the hospital longer.  °While you are in the hospital, you will receive help and instructions on how to care for yourself and your newborn during the postpartum period.  °While you are in the hospital: °· It is normal for you to have pain or discomfort from the incision in your abdomen. Be sure to tell your nurses when you are having pain, where the pain is located, and what makes the pain worse. °· If you are breastfeeding, you may feel uncomfortable contractions of your uterus for a couple of weeks. This is normal. The contractions help your uterus get back to normal size. °· It is normal to have some bleeding after delivery. °· For the first 1 3 days after delivery, the flow is red and the amount may be similar to a period. °· It is common for the flow to start and stop. °· In the first few days, you may pass some small clots. Let your nurses know if you begin to pass large clots or your flow increases. °· Do not  flush blood clots down the toilet before having the nurse look at them. °· During the next 3 10 days after delivery, your flow should become more watery and pink or brown-tinged in color. °· Ten to fourteen days after delivery, your flow should be a small amount of yellowish-white discharge. °· The amount of your flow will decrease over the first few weeks after delivery. Your flow may stop in 6 8 weeks. Most women have had their flow stop by 12 weeks after delivery. °· You should change your sanitary pads frequently. °· Wash your hands thoroughly with soap and water for at least 20 seconds after changing pads, using the toilet, or before holding or feeding your newborn. °· Your intravenous (IV) tubing will be removed when you are drinking enough fluids. °· The  urine drainage tube (urinary catheter) that was inserted before delivery may be removed within 6 8 hours after delivery or when feeling returns to your legs. You should feel like you need to empty your bladder within the first 6 8 hours after the catheter has been removed. °· In case you become weak, lightheaded, or faint, call your nurse before you get out of bed for the first time and before you take a shower for the first time. °· Within the first few days after delivery, your breasts may begin to feel tender and full. This is called engorgement. Breast tenderness usually goes away within 48 72 hours after engorgement occurs. You may also notice milk leaking from your breasts. If you are not breastfeeding, do not stimulate your breasts. Breast stimulation can make your breasts produce more milk. °· Spending as much time as possible with your newborn is very important. During this time, you and your newborn can feel close and get to know each other. Having your newborn stay in your room (rooming in) will help to strengthen the bond with your newborn. It will give you time to get to know your newborn and become comfortable caring for your newborn. °· Your hormones change after delivery. Sometimes the hormone changes can temporarily cause you to feel sad or tearful. These feelings should not last more than a few days. If these feelings last longer   than that, you should talk to your caregiver.  If desired, talk to your caregiver about methods of family planning or contraception.  Talk to your caregiver about immunizations. Your caregiver may want you to have the following immunizations before leaving the hospital:  Tetanus, diphtheria, and pertussis (Tdap) or tetanus and diphtheria (Td) immunization. It is very important that you and your family (including grandparents) or others caring for your newborn are up-to-date with the Tdap or Td immunizations. The Tdap or Td immunization can help protect your newborn  from getting ill.  Rubella immunization.  Varicella (chickenpox) immunization.  Influenza immunization. You should receive this annual immunization if you did not receive the immunization during your pregnancy. Document Released: 11/25/2011 Document Reviewed: 11/25/2011 [facility] Patient Information 2014 [place], [place]. Breastfeeding Deciding to breastfeed is one of the best choices you can make for you and your baby. A change in hormones during pregnancy causes your breast tissue to grow and increases the number and size of your milk ducts. These hormones also allow proteins, sugars, and fats from your blood supply to make breast milk in your milk-producing glands. Hormones prevent breast milk from being released before your baby is born as well as prompt milk flow after birth. Once breastfeeding has begun, thoughts of your baby, as well as his or her sucking or crying, can stimulate the release of milk from your milk-producing glands.  BENEFITS OF BREASTFEEDING For Your Baby  Your first milk (colostrum) helps your baby's digestive system function better.   There are antibodies in your milk that help your baby fight off infections.   Your baby has a lower incidence of asthma, allergies, and sudden infant death syndrome.   The nutrients in breast milk are better for your baby than infant formulas and are designed uniquely for your baby's needs.   Breast milk improves your baby's brain development.   Your baby is less likely to develop other conditions, such as childhood obesity, asthma, or type 2 diabetes mellitus.  For You   Breastfeeding helps to create a very special bond between you and your baby.   Breastfeeding is convenient. Breast milk is always available at the correct temperature and costs nothing.   Breastfeeding helps to burn calories and helps you lose the weight gained during pregnancy.   Breastfeeding makes your uterus contract to its prepregnancy size faster  and slows bleeding (lochia) after you give birth.   Breastfeeding helps to lower your risk of developing type 2 diabetes mellitus, osteoporosis, and breast or ovarian cancer later in life. SIGNS THAT YOUR BABY IS HUNGRY Early Signs of Hunger  Increased alertness or activity.  Stretching.  Movement of the head from side to side.  Movement of the head and opening of the mouth when the corner of the mouth or cheek is stroked (rooting).  Increased sucking sounds, smacking lips, cooing, sighing, or squeaking.  Hand-to-mouth movements.  Increased sucking of fingers or hands. Late Signs of Hunger  Fussing.  Intermittent crying. Extreme Signs of Hunger Signs of extreme hunger will require calming and consoling before your baby will be able to breastfeed successfully. Do not wait for the following signs of extreme hunger to occur before you initiate breastfeeding:   Restlessness.  A loud, strong cry.   Screaming. BREASTFEEDING BASICS Breastfeeding Initiation  Find a comfortable place to sit or lie down, with your neck and back well supported.  Place a pillow or rolled up blanket under your baby to bring him or her to  the level of your breast (if you are seated). Nursing pillows are specially designed to help support your arms and your baby while you breastfeed.  Make sure that your baby's abdomen is facing your abdomen.   Gently massage your breast. With your fingertips, massage from your chest wall toward your nipple in a circular motion. This encourages milk flow. You may need to continue this action during the feeding if your milk flows slowly.  Support your breast with 4 fingers underneath and your thumb above your nipple. Make sure your fingers are well away from your nipple and your baby's mouth.   Stroke your baby's lips gently with your finger or nipple.   When your baby's mouth is open wide enough, quickly bring your baby to your breast, placing your entire nipple  and as much of the colored area around your nipple (areola) as possible into your baby's mouth.   More areola should be visible above your baby's upper lip than below the lower lip.   Your baby's tongue should be between his or her lower gum and your breast.   Ensure that your baby's mouth is correctly positioned around your nipple (latched). Your baby's lips should create a seal on your breast and be turned out (everted).  It is common for your baby to suck about 2 3 minutes in order to start the flow of breast milk. Latching Teaching your baby how to latch on to your breast properly is very important. An improper latch can cause nipple pain and decreased milk supply for you and poor weight gain in your baby. Also, if your baby is not latched onto your nipple properly, he or she may swallow some air during feeding. This can make your baby fussy. Burping your baby when you switch breasts during the feeding can help to get rid of the air. However, teaching your baby to latch on properly is still the best way to prevent fussiness from swallowing air while breastfeeding. Signs that your baby has successfully latched on to your nipple:    Silent tugging or silent sucking, without causing you pain.   Swallowing heard between every 3 4 sucks.    Muscle movement above and in front of his or her ears while sucking.  Signs that your baby has not successfully latched on to nipple:   Sucking sounds or smacking sounds from your baby while breastfeeding.  Nipple pain. If you think your baby has not latched on correctly, slip your finger into the corner of your baby's mouth to break the suction and place it between your baby's gums. Attempt breastfeeding initiation again. Signs of Successful Breastfeeding Signs from your baby:   A gradual decrease in the number of sucks or complete cessation of sucking.   Falling asleep.   Relaxation of his or her body.   Retention of a small amount of  milk in his or her mouth.   Letting go of your breast by himself or herself. Signs from you:  Breasts that have increased in firmness, weight, and size 1 3 hours after feeding.   Breasts that are softer immediately after breastfeeding.  Increased milk volume, as well as a change in milk consistency and color by the 5th day of breastfeeding.   Nipples that are not sore, cracked, or bleeding. Signs That Your Pecola Leisure is Getting Enough Milk  Wetting at least 3 diapers in a 24-hour period. The urine should be clear and pale yellow by age 38 days.  At least  3 stools in a 24-hour period by age 715 days. The stool should be soft and yellow.  At least 3 stools in a 24-hour period by age 18 days. The stool should be seedy and yellow.  No loss of weight greater than 10% of birth weight during the first 433 days of age.  Average weight gain of 4 7 ounces (120 210 mL) per week after age 71 days.  Consistent daily weight gain by age 715 days, without weight loss after the age of 2 weeks. After a feeding, your baby may spit up a small amount. This is common. BREASTFEEDING FREQUENCY AND DURATION Frequent feeding will help you make more milk and can prevent sore nipples and breast engorgement. Breastfeed when you feel the need to reduce the fullness of your breasts or when your baby shows signs of hunger. This is called "breastfeeding on demand." Avoid introducing a pacifier to your baby while you are working to establish breastfeeding (the first 4 6 weeks after your baby is born). After this time you may choose to use a pacifier. Research has shown that pacifier use during the first year of a baby's life decreases the risk of sudden infant death syndrome (SIDS). Allow your baby to feed on each breast as long as he or she wants. Breastfeed until your baby is finished feeding. When your baby unlatches or falls asleep while feeding from the first breast, offer the second breast. Because newborns are often sleepy in  the first few weeks of life, you may need to awaken your baby to get him or her to feed. Breastfeeding times will vary from baby to baby. However, the following rules can serve as a guide to help you ensure that your baby is properly fed:  Newborns (babies 424 weeks of age or younger) may breastfeed every 1 3 hours.  Newborns should not go longer than 3 hours during the day or 5 hours during the night without breastfeeding.  You should breastfeed your baby a minimum of 8 times in a 24-hour period until you begin to introduce solid foods to your baby at around 496 months of age. BREAST MILK PUMPING Pumping and storing breast milk allows you to ensure that your baby is exclusively fed your breast milk, even at times when you are unable to breastfeed. This is especially important if you are going back to work while you are still breastfeeding or when you are not able to be present during feedings. Your lactation consultant can give you guidelines on how long it is safe to store breast milk.  A breast pump is a machine that allows you to pump milk from your breast into a sterile bottle. The pumped breast milk can then be stored in a refrigerator or freezer. Some breast pumps are operated by hand, while others use electricity. Ask your lactation consultant which type will work best for you. Breast pumps can be purchased, but some hospitals and breastfeeding support groups lease breast pumps on a monthly basis. A lactation consultant can teach you how to hand express breast milk, if you prefer not to use a pump.  CARING FOR YOUR BREASTS WHILE YOU BREASTFEED Nipples can become dry, cracked, and sore while breastfeeding. The following recommendations can help keep your breasts moisturized and healthy:  Avoid using soap on your nipples.   Wear a supportive bra. Although not required, special nursing bras and tank tops are designed to allow access to your breasts for breastfeeding without taking off your entire  bra or top. Avoid wearing underwire style bras or extremely tight bras.  Air dry your nipples for 3 4minutes after each feeding.   Use only cotton bra pads to absorb leaked breast milk. Leaking of breast milk between feedings is normal.   Use lanolin on your nipples after breastfeeding. Lanolin helps to maintain your skin's normal moisture barrier. If you use pure lanolin you do not need to wash it off before feeding your baby again. Pure lanolin is not toxic to your baby. You may also hand express a few drops of breast milk and gently massage that milk into your nipples and allow the milk to air dry. In the first few weeks after giving birth, some women experience extremely full breasts (engorgement). Engorgement can make your breasts feel heavy, warm, and tender to the touch. Engorgement peaks within 3 5 days after you give birth. The following recommendations can help ease engorgement:  Completely empty your breasts while breastfeeding or pumping. You may want to start by applying warm, moist heat (in the shower or with warm water-soaked hand towels) just before feeding or pumping. This increases circulation and helps the milk flow. If your baby does not completely empty your breasts while breastfeeding, pump any extra milk after he or she is finished.  Wear a snug bra (nursing or regular) or tank top for 1 2 days to signal your body to slightly decrease milk production.  Apply ice packs to your breasts, unless this is too uncomfortable for you.  Make sure that your baby is latched on and positioned properly while breastfeeding. If engorgement persists after 48 hours of following these recommendations, contact your health care provider or a Advertising copywriterlactation consultant. OVERALL HEALTH CARE RECOMMENDATIONS WHILE BREASTFEEDING  Eat healthy foods. Alternate between meals and snacks, eating 3 of each per day. Because what you eat affects your breast milk, some of the foods may make your baby more  irritable than usual. Avoid eating these foods if you are sure that they are negatively affecting your baby.  Drink milk, fruit juice, and water to satisfy your thirst (about 10 glasses a day).   Rest often, relax, and continue to take your prenatal vitamins to prevent fatigue, stress, and anemia.  Continue breast self-awareness checks.  Avoid chewing and smoking tobacco.  Avoid alcohol and drug use. Some medicines that may be harmful to your baby can pass through breast milk. It is important to ask your health care provider before taking any medicine, including all over-the-counter and prescription medicine as well as vitamin and herbal supplements. It is possible to become pregnant while breastfeeding. If birth control is desired, ask your health care provider about options that will be safe for your baby. SEEK MEDICAL CARE IF:   You feel like you want to stop breastfeeding or have become frustrated with breastfeeding.  You have painful breasts or nipples.  Your nipples are cracked or bleeding.  Your breasts are red, tender, or warm.  You have a swollen area on either breast.  You have a fever or chills.  You have nausea or vomiting.  You have drainage other than breast milk from your nipples.  Your breasts do not become full before feedings by the 5th day after you give birth.  You feel sad and depressed.  Your baby is too sleepy to eat well.  Your baby is having trouble sleeping.   Your baby is wetting less than 3 diapers in a 24-hour period.  Your baby has less than  3 stools in a 24-hour period.  Your baby's skin or the white part of his or her eyes becomes yellow.   Your baby is not gaining weight by 67 days of age. SEEK IMMEDIATE MEDICAL CARE IF:   Your baby is overly tired (lethargic) and does not want to wake up and feed.  Your baby develops an unexplained fever. Document Released: 03/02/2005 Document Revised: 11/02/2012 Document Reviewed:  08/24/2012 [facility] Patient Information 2014 [place], [place].

## 2013-07-21 NOTE — Discharge Summary (Signed)
Obstetric Discharge Summary Reason for Admission: cesarean section Prenatal Procedures: none Intrapartum Procedures: cesarean: low cervical, transverse Postpartum Procedures: none Complications-Operative and Postpartum: none Hemoglobin  Date Value Ref Range Status  07/19/2013 9.3* 12.0 - 15.0 g/dL Final     DELTA CHECK NOTED     REPEATED TO VERIFY     HCT  Date Value Ref Range Status  07/19/2013 27.9* 36.0 - 46.0 % Final    Physical Exam:  General: alert and no distress CV: RRR, No MRG Lungs: CTA BL Lochia: appropriate Uterine Fundus: firm, below the umbilicus Incision: healing well DVT Evaluation: No evidence of DVT seen on physical exam. Negative Homan's sign.  Discharge Diagnoses: Preterm Cesarean Section  Hospital Course: Holli HumblesJessica L Mcbroom is 28 y.o. G1P0000 with a history of GHTN admitted with IUP at 942w2d for PROM and Preterm labor who underwent a LTCS  Without complication for transverse breech presentation. For more detailed report of surgery please see operative note.   Viable female infant in Breech presentation. Apgars 8 and 9. Clear amniotic fluid. Intact placenta, three vessel cord. Normal uterus, fallopian tubes and ovaries bilaterally.  ANESTHESIA: Spinal  INTRAVENOUS FLUIDS: 1500 ml  ESTIMATED BLOOD LOSS: 700 ml  URINE OUTPUT: 350 ml  SPECIMENS: Placenta sent to L&D  COMPLICATIONS: None immediate  Patient was stable during the postoperative period and was discharged home on POD#3. Breastfeeding. Plans condoms for contraception   Discharge Information: Date: 07/21/2013 Activity: pelvic rest Diet: routine Medications: Ibuprofen, Colace and Percocet Condition: stable Instructions: refer to practice specific booklet Discharge to: home Follow-up Information   [facility] [place]. Schedule an appointment as soon as possible for a visit in 4 weeks. (Call to schedule Postpartum Follow-up Appointment)    Specialty:   Obstetrics and Gynecology   Contact information:   [address], [address] [place] [id number] [phone]      Newborn Data: Live born female  Birth Weight: 6 lb 3.7 oz (2825 g) APGAR: 8, 9  Home with mother.  Myriam JacobsonRobyn H Restrepo 07/21/2013, 7:47 AM  I have seen and examined this patient and agree the above assessment. Scarlette CalicoFrances Cresenzo-Dishmon 07/21/2013 8:04 AM

## 2013-07-24 NOTE — Discharge Summary (Signed)
Attestation of Attending Supervision of Advanced Practitioner (CNM/NP): Evaluation and management procedures were performed by the Advanced Practitioner under my supervision and collaboration.  I have reviewed the Advanced Practitioner's note and chart, and I agree with the management and plan.  Ryen Heitmeyer 07/24/2013 7:34 AM

## 2013-07-27 ENCOUNTER — Ambulatory Visit (HOSPITAL_COMMUNITY): Admit: 2013-07-27 | Payer: BC Managed Care – PPO

## 2013-07-30 NOTE — Progress Notes (Signed)
`````  Attestation of Attending Supervision of Advanced Practitioner: Evaluation and management procedures were performed by the PA student under my supervision/collaboration. Chart reviewed, patient seen with PA-S  and agree with management and plan.  Tilda BurrowJohn V Ingri Diemer 07/30/2013 5:50 PM

## 2013-08-03 ENCOUNTER — Ambulatory Visit (HOSPITAL_COMMUNITY): Payer: BC Managed Care – PPO

## 2013-08-18 ENCOUNTER — Encounter: Payer: Self-pay | Admitting: Advanced Practice Midwife

## 2013-08-18 ENCOUNTER — Ambulatory Visit (INDEPENDENT_AMBULATORY_CARE_PROVIDER_SITE_OTHER): Payer: BC Managed Care – PPO | Admitting: Advanced Practice Midwife

## 2013-08-18 VITALS — BP 126/78 | HR 82 | Resp 16 | Ht 65.0 in | Wt 140.0 lb

## 2013-08-18 DIAGNOSIS — Z09 Encounter for follow-up examination after completed treatment for conditions other than malignant neoplasm: Secondary | ICD-10-CM

## 2013-08-18 DIAGNOSIS — Z9889 Other specified postprocedural states: Secondary | ICD-10-CM

## 2013-08-18 NOTE — Progress Notes (Signed)
  Subjective:     Victoria Lin is a 28 y.o. female who presents for a postpartum visit. She is 4 weeks postpartum following a low cervical transverse Cesarean section for Breech presentation. I have fully reviewed the prenatal and intrapartum course. The delivery was at term. Outcome: primary cesarean section, low transverse incision. Anesthesia: regional. Postpartum course has been uneventful. Baby's course has been uneventful. Baby is feeding by breast. Bleeding no bleeding. Bowel function is normal. Bladder function is normal. Patient is not sexually active. Contraception method is condoms. Postpartum depression screening: negative.  The following portions of the patient's history were reviewed and updated as appropriate: allergies, current medications, past family history, past medical history, past social history, past surgical history and problem list.  Review of Systems Pertinent items are noted in HPI.   Objective:    BP 126/78  Pulse 82  Resp 16  Ht 5\' 5"  (1.651 m)  Wt 63.504 kg (140 lb)  BMI 23.30 kg/m2  Breastfeeding? Yes  General:  alert, cooperative and no distress   Breasts:  inspection negative, no nipple discharge or bleeding, no masses or nodularity palpable  Lungs: clear to auscultation bilaterally  Heart:  regular rate and rhythm, S1, S2 normal, no murmur, click, rub or gallop  Abdomen: soft, non-tender; bowel sounds normal; no masses,  no organomegaly   Incision well healed. Last 5 steristrips removed   Vulva:  not evaluated  Vagina: not evaluated  Cervix:  n/a  Corpus: not examined  Adnexa:  not evaluated  Rectal Exam: Not performed.        Assessment:     Normal PostOperative/ postpartum exam. Pap smear not done at today's visit.   Plan:    1. Contraception: condoms 2. Reviewed normal healing of incision 3. Follow up in: 1 year or as needed.

## 2013-08-18 NOTE — Patient Instructions (Signed)
Postpartum Care After Cesarean Delivery °After you deliver your newborn (postpartum period), the usual stay in the hospital is 24 72 hours. If there were problems with your labor or delivery, or if you have other medical problems, you might be in the hospital longer.  °While you are in the hospital, you will receive help and instructions on how to care for yourself and your newborn during the postpartum period.  °While you are in the hospital: °· It is normal for you to have pain or discomfort from the incision in your abdomen. Be sure to tell your nurses when you are having pain, where the pain is located, and what makes the pain worse. °· If you are breastfeeding, you may feel uncomfortable contractions of your uterus for a couple of weeks. This is normal. The contractions help your uterus get back to normal size. °· It is normal to have some bleeding after delivery. °· For the first 1 3 days after delivery, the flow is red and the amount may be similar to a period. °· It is common for the flow to start and stop. °· In the first few days, you may pass some small clots. Let your nurses know if you begin to pass large clots or your flow increases. °· Do not  flush blood clots down the toilet before having the nurse look at them. °· During the next 3 10 days after delivery, your flow should become more watery and pink or brown-tinged in color. °· Ten to fourteen days after delivery, your flow should be a small amount of yellowish-white discharge. °· The amount of your flow will decrease over the first few weeks after delivery. Your flow may stop in 6 8 weeks. Most women have had their flow stop by 12 weeks after delivery. °· You should change your sanitary pads frequently. °· Wash your hands thoroughly with soap and water for at least 20 seconds after changing pads, using the toilet, or before holding or feeding your newborn. °· Your intravenous (IV) tubing will be removed when you are drinking enough fluids. °· The  urine drainage tube (urinary catheter) that was inserted before delivery may be removed within 6 8 hours after delivery or when feeling returns to your legs. You should feel like you need to empty your bladder within the first 6 8 hours after the catheter has been removed. °· In case you become weak, lightheaded, or faint, call your nurse before you get out of bed for the first time and before you take a shower for the first time. °· Within the first few days after delivery, your breasts may begin to feel tender and full. This is called engorgement. Breast tenderness usually goes away within 48 72 hours after engorgement occurs. You may also notice milk leaking from your breasts. If you are not breastfeeding, do not stimulate your breasts. Breast stimulation can make your breasts produce more milk. °· Spending as much time as possible with your newborn is very important. During this time, you and your newborn can feel close and get to know each other. Having your newborn stay in your room (rooming in) will help to strengthen the bond with your newborn. It will give you time to get to know your newborn and become comfortable caring for your newborn. °· Your hormones change after delivery. Sometimes the hormone changes can temporarily cause you to feel sad or tearful. These feelings should not last more than a few days. If these feelings last longer   than that, you should talk to your caregiver. °· If desired, talk to your caregiver about methods of family planning or contraception. °· Talk to your caregiver about immunizations. Your caregiver may want you to have the following immunizations before leaving the hospital: °· Tetanus, diphtheria, and pertussis (Tdap) or tetanus and diphtheria (Td) immunization. It is very important that you and your family (including grandparents) or others caring for your newborn are up-to-date with the Tdap or Td immunizations. The Tdap or Td immunization can help protect your newborn  from getting ill. °· Rubella immunization. °· Varicella (chickenpox) immunization. °· Influenza immunization. You should receive this annual immunization if you did not receive the immunization during your pregnancy. °Document Released: 11/25/2011 Document Reviewed: 11/25/2011 °Exit[employer]® Patient Information ©2014 [employer]. ° °

## 2014-01-15 ENCOUNTER — Encounter: Payer: Self-pay | Admitting: Advanced Practice Midwife

## 2014-09-12 ENCOUNTER — Ambulatory Visit (INDEPENDENT_AMBULATORY_CARE_PROVIDER_SITE_OTHER): Payer: BLUE CROSS/BLUE SHIELD | Admitting: Obstetrics & Gynecology

## 2014-09-12 ENCOUNTER — Encounter: Payer: Self-pay | Admitting: Obstetrics & Gynecology

## 2014-09-12 VITALS — BP 133/83 | HR 82 | Resp 16 | Ht 65.0 in | Wt 126.0 lb

## 2014-09-12 DIAGNOSIS — Z124 Encounter for screening for malignant neoplasm of cervix: Secondary | ICD-10-CM | POA: Diagnosis not present

## 2014-09-12 DIAGNOSIS — Z01419 Encounter for gynecological examination (general) (routine) without abnormal findings: Secondary | ICD-10-CM

## 2014-09-12 DIAGNOSIS — Z98891 History of uterine scar from previous surgery: Secondary | ICD-10-CM | POA: Insufficient documentation

## 2014-09-12 NOTE — Progress Notes (Signed)
  Subjective:     Victoria Lin is a 29 y.o. female here for a routine exam.  Current complaints: none.  Uses condoms.  Thinking of conceiving soon.     Gynecologic History Patient's last menstrual period was 08/22/2014. Contraception: condoms Last Pap: 2013. Results were: normal Last mammogram: n/a.  Obstetric History OB History  Gravida Para Term Preterm AB SAB TAB Ectopic Multiple Living  1 1 0 1 0 0 0 0 0 1     # Outcome Date GA Lbr Len/2nd Weight Sex Delivery Anes PTL Lv  1 Preterm 07/18/13 4366w2d  6 lb 3.7 oz (2.825 kg) F CS-LTranv Spinal  Y       The following portions of the patient's history were reviewed and updated as appropriate: allergies, current medications, past family history, past medical history, past social history, past surgical history and problem list.  Review of Systems A comprehensive review of systems was negative.    Objective:         Filed Vitals:   09/12/14 1002  BP: 133/83  Pulse: 82  Resp: 16  Height: 5\' 5"  (1.651 m)  Weight: 126 lb (57.153 kg)   Vitals:  WNL General appearance: alert, cooperative and no distress Head: Normocephalic, without obvious abnormality, atraumatic Eyes: negative Throat: lips, mucosa, and tongue normal; teeth and gums normal Lungs: clear to auscultation bilaterally Breasts: normal appearance, no masses or tenderness, No nipple retraction or dimpling, No nipple discharge or bleeding Heart: regular rate and rhythm Abdomen: soft, non-tender; bowel sounds normal; no masses,  no organomegaly  Pelvic:  External Genitalia:  Tanner V, no lesion Urethra:  No prolapse Vagina:  Pink, normal rugae, no blood or discharge Cervix:  No CMT, no lesion Uterus:  Normal size and contour, non tender Adnexa:  Normal, no masses, non tender  Extremities: no edema, redness or tenderness in the calves or thighs Skin: no lesions or rash Lymph nodes: Axillary adenopathy: none    Assessment:    Healthy female exam.    Plan:     Education reviewed: self breast exams and skin cancer screening. Contraception: condoms. Follow up in: 1 year. Continue PNV if thinking of conceiving

## 2014-09-12 NOTE — Addendum Note (Signed)
Addended by: Granville LewisLARK, Tori Dattilio L on: 09/12/2014 11:26 AM   Modules accepted: Orders

## 2014-09-14 LAB — CYTOLOGY - PAP

## 2014-12-14 IMAGING — US US OB COMP +14 WK
2 series · 12 of 28 positions shown · non-contrast
Comparison: none

[Series 1: us ob comp +14 wk · 9 of 73 slices shown (1 of 2)]
[im 4/73]
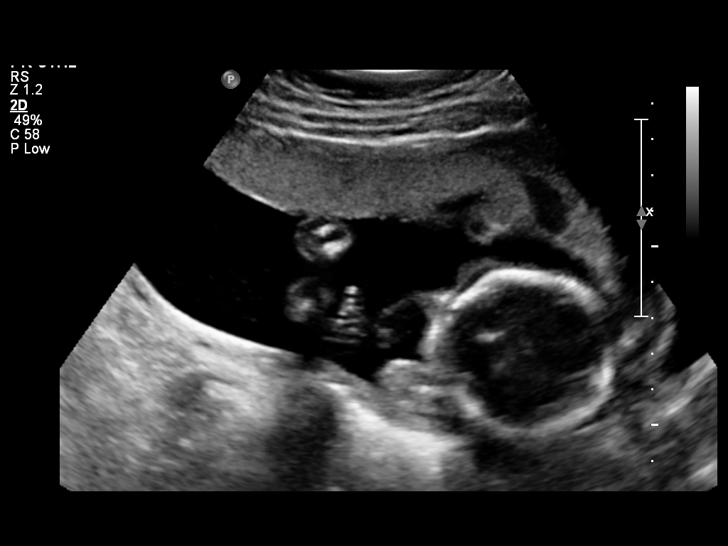
[im 11/73]
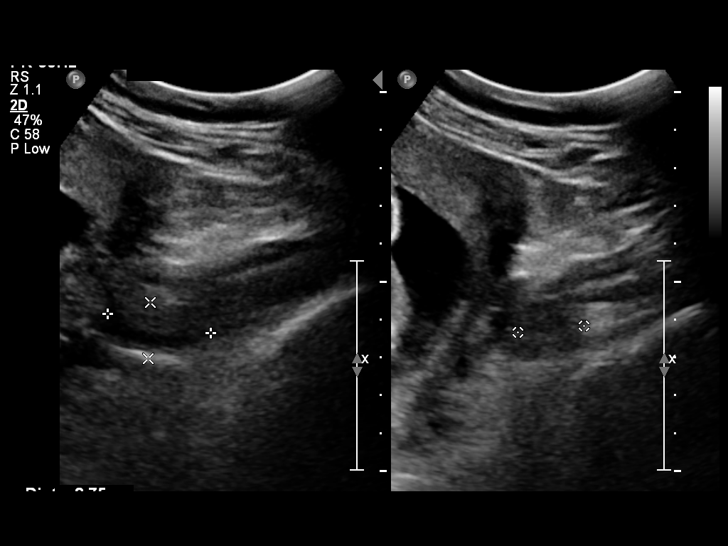
[im 18/73]
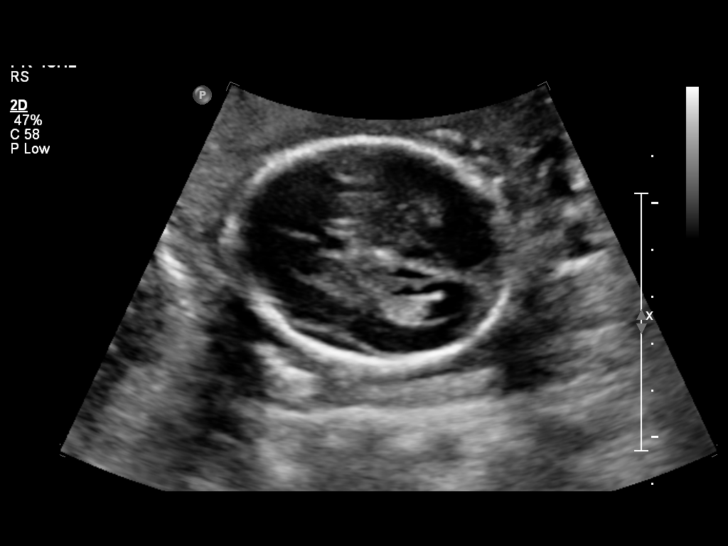
[im 28/73]
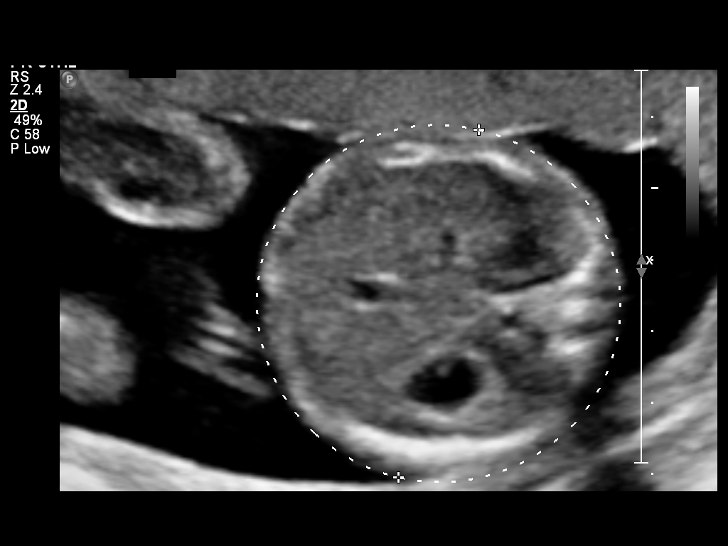
[im 35/73]
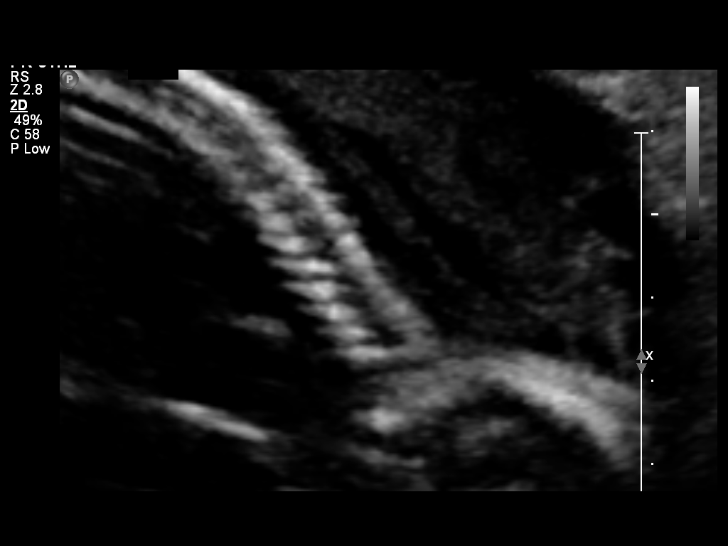
[im 42/73]
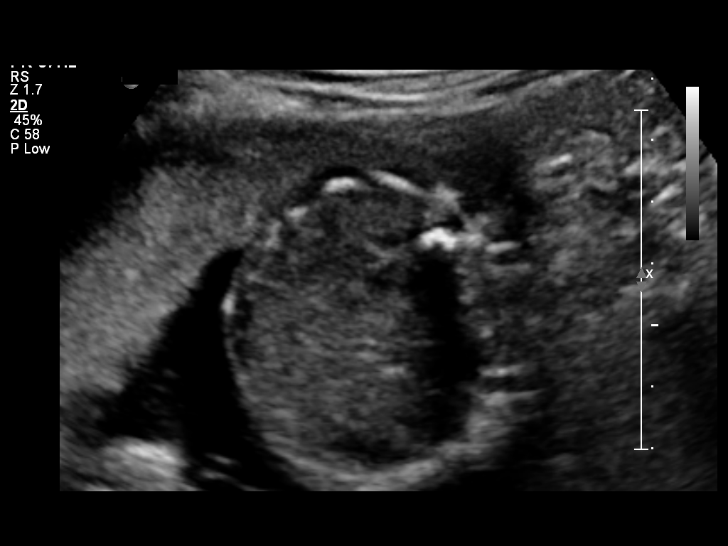
[im 52/73]
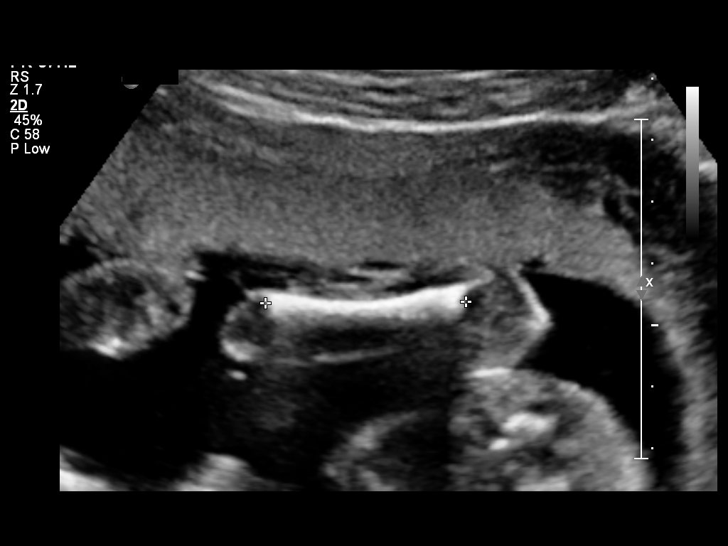
[im 59/73]
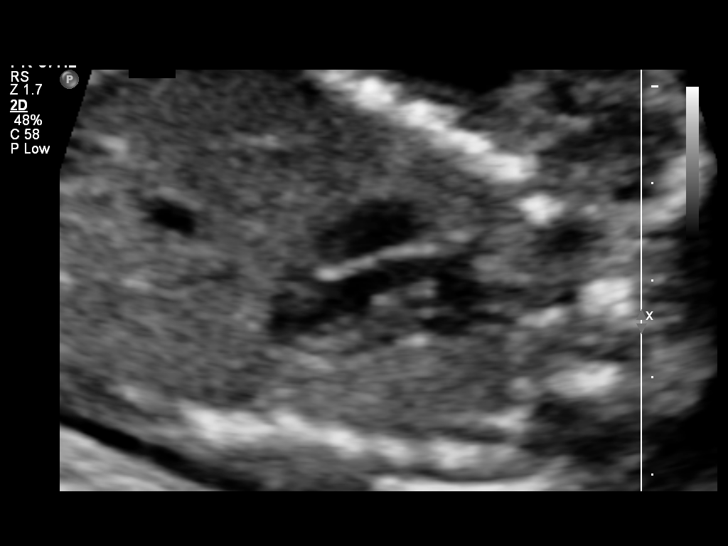
[im 66/73]
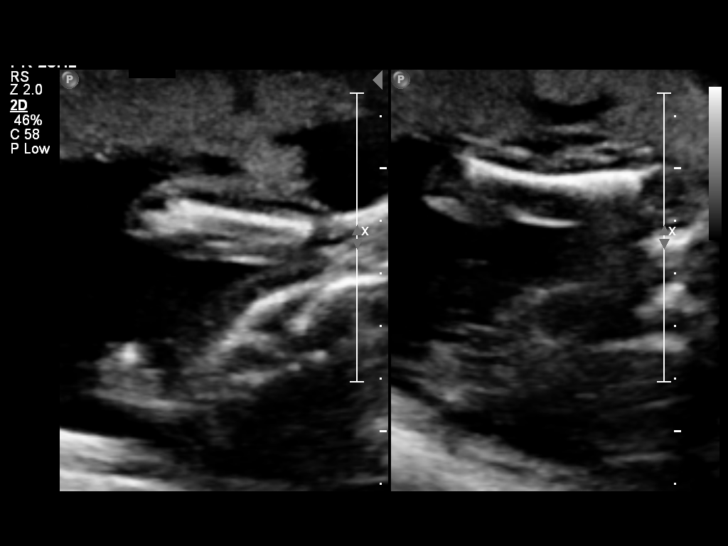

[Series 1: us ob comp +14 wk · 3 of 22 slices shown (2 of 2)]
[im 1/22]
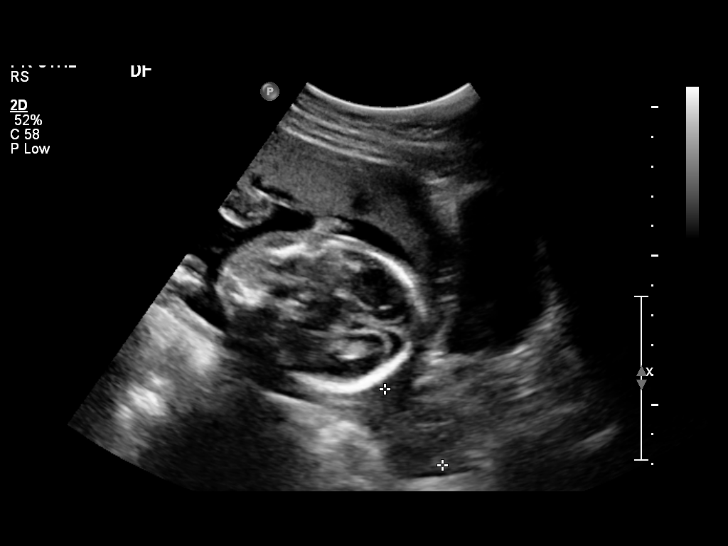
[im 9/22]
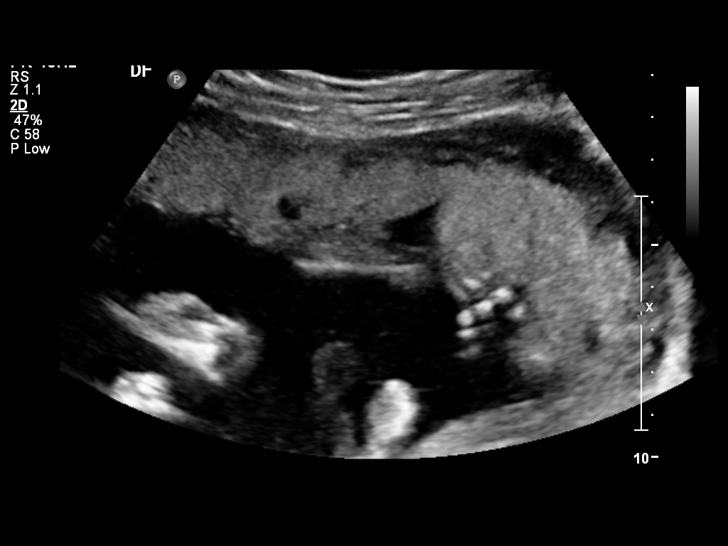
[im 17/22]
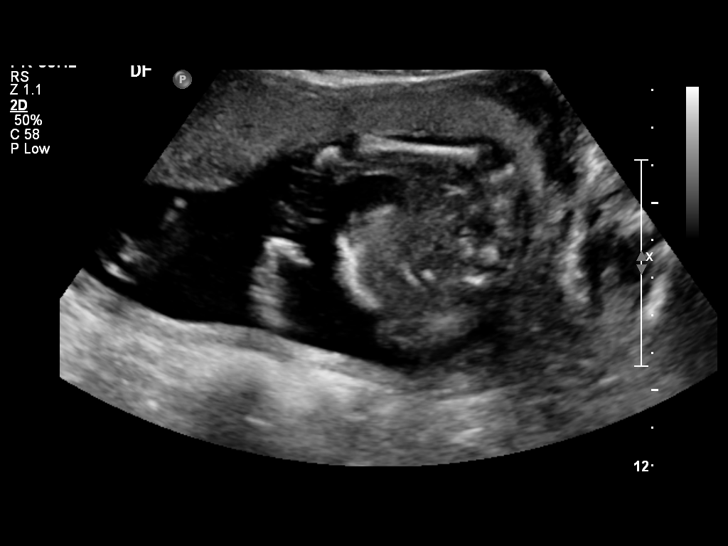

[12 of 28 positions shown; findings below may reference images not displayed]

OBSTETRICS REPORT
                      (Signed Final 03/31/2013 [DATE])

                                                         CNM
Service(s) Provided

 US OB COMP + 14 WK                                    76805.1
Indications

 Basic anatomic survey
Fetal Evaluation

 Num Of Fetuses:    1
 Fetal Heart Rate:  155                          bpm
 Cardiac Activity:  Observed
 Presentation:      Cephalic
 Placenta:          Anterior, above cervical os
 P. Cord            Visualized, central
 Insertion:

 Amniotic Fluid
 AFI FV:      Subjectively within normal limits
                                             Larg Pckt:     3.5  cm
Biometry

 BPD:     48.1  mm     G. Age:  20w 4d                CI:        72.48   70 - 86
                                                      FL/HC:      18.8   16.8 -

 HC:     179.7  mm     G. Age:  20w 3d       74  %    HC/AC:      1.15   1.09 -

 AC:     156.4  mm     G. Age:  20w 6d       78  %    FL/BPD:
 FL:      33.8  mm     G. Age:  20w 4d       74  %    FL/AC:      21.6   20 - 24
 HUM:     32.3  mm     G. Age:  20w 6d       82  %
 CER:     18.9  mm     G. Age:  18w 3d       14  %
 NFT:     3.29  mm

 Est. FW:     370  gm    0 lb 13 oz      61  %
Gestational Age

 LMP:           19w 5d        Date:  11/13/12                 EDD:   08/20/13
 U/S Today:     20w 4d                                        EDD:   08/14/13
 Best:          19w 5d     Det. By:  LMP  (11/13/12)          EDD:   08/20/13
Anatomy

 Cranium:          Appears normal         Aortic Arch:      Not well visualized
 Fetal Cavum:      Appears normal         Ductal Arch:      Not well visualized
 Ventricles:       Appears normal         Diaphragm:        Appears normal
 Choroid Plexus:   Appears normal         Stomach:          Appears normal, left
                                                            sided
 Cerebellum:       Appears normal         Abdomen:          Appears normal
 Posterior Fossa:  Appears normal         Abdominal Wall:   Appears nml (cord
                                                            insert, abd wall)
 Nuchal Fold:      Appears normal         Cord Vessels:     Appears normal (3
                                                            vessel cord)
 Face:             Appears normal         Kidneys:          Appear normal
                   (orbits and profile)
 Lips:             Appears normal         Bladder:          Appears normal
 Heart:            Appears normal         Spine:            Appears normal
                   (4CH, axis, and
                   situs)
 RVOT:             Appears normal         Lower             Appears normal
                                          Extremities:
 LVOT:             Appears normal         Upper             Appears normal
                                          Extremities:

 Other:  Fetus appears to be a female. Heels and 5th digit visualized. Nasal
         bone visualized. Technically difficult due to fetal position.
Targeted Anatomy

 Fetal Central Nervous System
 Lat. Ventricles:  4.7                    Cisterna Magna:
Cervix Uterus Adnexa

 Cervical Length:    3.12     cm

 Cervix:       Normal appearance by transabdominal scan.
 Left Ovary:    Within normal limits.
 Right Ovary:   Within normal limits.

 Adnexa:     No abnormality visualized.
Impression

 Active SIUP at 19w8d on fetal survey
 No apparent dysmorphic features
 EFW 61st percentile
 No previa
 Amniotic fluid is gestational age appropriate
Recommendations

 Follow up ultrasounds as clinically indicated.

 questions or concerns.

## 2015-02-01 ENCOUNTER — Encounter: Payer: Self-pay | Admitting: Obstetrics and Gynecology

## 2015-02-01 ENCOUNTER — Ambulatory Visit (INDEPENDENT_AMBULATORY_CARE_PROVIDER_SITE_OTHER): Payer: BLUE CROSS/BLUE SHIELD | Admitting: Obstetrics and Gynecology

## 2015-02-01 VITALS — BP 136/86 | HR 70 | Wt 132.0 lb

## 2015-02-01 DIAGNOSIS — Z98891 History of uterine scar from previous surgery: Secondary | ICD-10-CM

## 2015-02-01 DIAGNOSIS — Z8759 Personal history of other complications of pregnancy, childbirth and the puerperium: Secondary | ICD-10-CM

## 2015-02-01 DIAGNOSIS — Z113 Encounter for screening for infections with a predominantly sexual mode of transmission: Secondary | ICD-10-CM

## 2015-02-01 DIAGNOSIS — O09211 Supervision of pregnancy with history of pre-term labor, first trimester: Secondary | ICD-10-CM | POA: Diagnosis not present

## 2015-02-01 NOTE — Patient Instructions (Addendum)
Hydroxyprogesterone solution for injection What is this medicine? HYDROXYPROGESTERONE (hye drox ee proe JES ter one) is a female hormone. This medicine is used in women who are pregnant and who have delivered a baby too early (preterm) in the past. It helps lower the risk of having a preterm baby again. This medicine may be used for other purposes; ask your health care provider or pharmacist if you have questions. What should I tell my health care provider before I take this medicine? They need to know if you have any of these conditions: -blood clotting disorders -breast, cervical, uterine, or vaginal cancer -depression -diabetes or prediabetes -heart disease -high blood pressure -kidney disease -liver disease -lung or breathing disease, like asthma -migraine headaches -seizures -vaginal bleeding -an unusual or allergic reaction to hydroxyprogesterone, other hormones, medicines, foods, dyes, castor oil, benzyl alcohol, or other preservatives -breast-feeding How should I use this medicine? This medicine is for injection into a muscle. It is given by a health care professional in a hospital or clinic setting. You are likely to get an injection once a week to prevent preterm delivery. Talk to your pediatrician regarding the use of this medicine in children. Special care may be needed. Overdosage: If you think you have taken too much of this medicine contact a poison control center or emergency room at once. NOTE: This medicine is only for you. Do not share this medicine with others. What if I miss a dose? It is important not to miss your dose. Call your doctor or health care professional if you are unable to keep an appointment. What may interact with this medicine? -acetaminophen -bupropion -clozapine -efavirenz -halothane -methadone -nicotine -theophylline, aminophylline -tizanidine This list may not describe all possible interactions. Give your health care provider a list of all  the medicines, herbs, non-prescription drugs, or dietary supplements you use. Also tell them if you smoke, drink alcohol, or use illegal drugs. Some items may interact with your medicine. What should I watch for while using this medicine? Your condition will be monitored carefully while you are receiving this medicine. What side effects may I notice from receiving this medicine? Side effects that you should report to your doctor or health care professional as soon as possible: -allergic reactions like skin rash, itching or hives, swelling of the face, lips, or tongue -breathing problems -breast tissue changes or discharge -changes in vision -confusion, trouble speaking or understanding -depressed mood -increased hunger or thirst -increased urination -pain, redness, or irritation at site where injected -pain, swelling, warmth in the leg -shortness of breath, chest pain, swelling in a leg -sudden numbness or weakness of the face, arm or leg -sudden severe headaches -trouble walking, dizziness, loss of balance or coordination -unusually weak or tired -vaginal bleeding -yellowing of the eyes or skin Side effects that usually do not require medical attention (report to your doctor or health care professional if they continue or are bothersome): -changes in emotions or moods -diarrhea -fluid retention and swelling -nausea This list may not describe all possible side effects. Call your doctor for medical advice about side effects. You may report side effects to FDA at 1-800-FDA-1088. Where should I keep my medicine? This drug is given in a hospital or clinic and will not be stored at home. NOTE: This sheet is a summary. It may not cover all possible information. If you have questions about this medicine, talk to your doctor, pharmacist, or health care provider.    2016, Elsevier/Gold Standard. (2009-04-23 11:17:12) Vaginal Birth After  Cesarean Delivery Vaginal birth after cesarean  delivery (VBAC) is giving birth vaginally after previously delivering a baby by a cesarean. In the past, if a woman had a cesarean delivery, all births afterward would be done by cesarean delivery. This is no longer true. It can be safe for the mother to try a vaginal delivery after having a cesarean delivery.  It is important to discuss VBAC with your health care provider early in the pregnancy so you can understand the risks, benefits, and options. It will give you time to decide what is best in your particular case. The final decision about whether to have a VBAC or repeat cesarean delivery should be between you and your health care provider. Any changes in your health or your baby's health during your pregnancy may make it necessary to change your initial decision about VBAC.  WOMEN WHO PLAN TO HAVE A VBAC SHOULD CHECK WITH THEIR HEALTH CARE PROVIDER TO BE SURE THAT:  The previous cesarean delivery was done with a low transverse uterine cut (incision) (not a vertical classical incision).   The birth canal is big enough for the baby.   There were no other operations on the uterus.   An electronic fetal monitor (EFM) will be on at all times during labor.   An operating room will be available and ready in case an emergency cesarean delivery is needed.   A health care provider and surgical nursing staff will be available at all times during labor to be ready to do an emergency delivery cesarean if necessary.   An anesthesiologist will be present in case an emergency cesarean delivery is needed.   The nursery is prepared and has adequate personnel and necessary equipment available to care for the baby in case of an emergency cesarean delivery. BENEFITS OF VBAC  Shorter stay in the hospital.   Avoidance of risks associated with cesarean delivery, such as:  Surgical complications, such as opening of the incision or hernia in the incision.  Injury to other organs.  Fever. This can  occur if an infection develops after surgery. It can also occur as a reaction to the medicine given to make you numb during the surgery.  Less blood loss and need for blood transfusions.  Lower risk of blood clots and infection.  Shorter recovery.   Decreased risk for having to remove the uterus (hysterectomy).   Decreased risk for the placenta to completely or partially cover the opening of the uterus (placenta previa) with a future pregnancy.   Decrease risk in future labor and delivery. RISKS OF A VBAC  Tearing (rupture) of the uterus. This is occurs in less than 1% of VBACs. The risk of this happening is higher if:  Steps are taken to begin the labor process (induce labor) or stimulate or strengthen contractions (augment labor).   Medicine is used to soften (ripen) the cervix.  Having to remove the uterus (hysterectomy) if it ruptures. VBAC SHOULD NOT BE DONE IF:  The previous cesarean delivery was done with a vertical (classical) or T-shaped incision or you do not know what kind of incision was made.   You had a ruptured uterus.   You have had certain types of surgery on your uterus, such as removal of uterine fibroids. Ask your health care provider about other types of surgeries that prevent you from having a VBAC.  You have certain medical or childbirth (obstetrical) problems.   There are problems with the baby.   You have had  two previous cesarean deliveries and no vaginal deliveries. OTHER FACTS TO KNOW ABOUT VBAC:  It is safe to have an epidural anesthetic with VBAC.   It is safe to turn the baby from a breech position (attempt an external cephalic version).   It is safe to try a VBAC with twins.   VBAC may not be successful if your baby weights 8.8 lb (4 kg) or more. However, weight predictions are not always accurate and should not be used alone to decide if VBAC is right for you.  There is an increased failure rate if the time between the cesarean  delivery and VBAC is less than 19 months.   Your health care provider may advise against a VBAC if you have preeclampsia (high blood pressure, protein in the urine, and swelling of face and extremities).   VBAC is often successful if you previously gave birth vaginally.   VBAC is often successful when the labor starts spontaneously before the due date.   Delivering a baby through a VBAC is similar to having a normal spontaneous vaginal delivery.   This information is not intended to replace advice given to you by your health care provider. Make sure you discuss any questions you have with your health care provider.   Document Released: 08/23/2006 Document Revised: 03/23/2014 Document Reviewed: 09/29/2012 Elsevier Interactive Patient Education [employer]

## 2015-02-01 NOTE — Progress Notes (Signed)
Bedside U/S today shows IUP with CRL of 13.448mm and GA is 7w 4d

## 2015-02-01 NOTE — Progress Notes (Signed)
   Subjective:    Victoria Lin is a [id number] 4023w0d being seen today for her first obstetrical visit.  Her obstetrical history is significant for LTC/S at 35w 2d for PROM, breech; GHTN. . Patient does intend to breast feed. Pregnancy history fully reviewed. [facility]; unsure re: [facility]> info given. Does not want genetic screening.   Patient reports no complaints.  Filed Vitals:   02/01/15 0934  BP: 136/86  Pulse: 70  Weight: 132 lb (59.875 kg)    HISTORY: OB History  Gravida Para Term Preterm AB SAB TAB Ectopic Multiple Living  2 1 0 1 0 0 0 0 0 1     # Outcome Date GA Lbr Len/2nd Weight Sex Delivery Anes PTL Lv  2 Current           1 Preterm 07/18/13 5016w2d  6 lb 3.7 oz (2.825 kg) F CS-LTranv Spinal  Y     Past Medical History  Diagnosis Date  . IBS (irritable bowel syndrome)   . Heart murmur     H/O as a child   Past Surgical History  Procedure Laterality Date  . Wisdom tooth extraction    . Cesarean section N/A 07/18/2013    Procedure: CESAREAN SECTION;  Surgeon: Adam PhenixJames G [name], MD;  Location: [facility];  Service: Obstetrics;  Laterality: N/A;   Family History  Problem Relation Age of Onset  . Rheum arthritis Father   . Heart attack Father   . Lupus Father   . Cancer Father     skin  . Heart attack Mother      Exam    Uterus:   c/w 8wk size  Pelvic Exam:    Perineum: No Hemorrhoids, Normal Perineum   Vulva: normal, Bartholin's, Urethra, Skene's normal   Vagina:  normal mucosa, normal discharge       Cervix: no bleeding following Pap   Adnexa: not evaluated   Bony Pelvis: average  System: Breast:  normal appearance, no masses or tenderness, No nipple retraction or dimpling, No nipple discharge or bleeding   Skin: normal coloration and turgor, no rashes    Neurologic: oriented, normal, grossly non-focal   Extremities: normal strength, tone, and muscle mass, no musculoskeletal defects noted   HEENT PERRLA, sclera clear, anicteric, neck supple with  midline trachea and thyroid without masses   Mouth/Teeth mucous membranes moist, pharynx normal without lesions and dental hygiene good   Neck supple   Cardiovascular: regular rate and rhythm, no murmurs or gallops   Respiratory:  appears well, vitals normal, no respiratory distress, acyanotic, normal RR, ear and throat exam is normal, neck free of mass or lymphadenopathy, chest clear, no wheezing, crepitations, rhonchi, normal symmetric air entry   Abdomen: soft, non-tender; bowel sounds normal; no masses,  no organomegaly   Urinary: urethral meatus normal      Assessment:    Pregnancy: [id number] Patient Active Problem List   Diagnosis Date Noted  . Previous preterm delivery in first trimester, antepartum 02/01/2015  . History of gestational hypertension 02/01/2015  . History of cesarean section 09/12/2014  . Chronic headaches 05/18/2012        Plan:     Initial labs drawn. Prenatal vitamins. Problem list reviewed and updated. Genetic Screening discussed First Screen: declined.  Ultrasound discussed; fetal survey: requested.  Follow up in 4 weeks. 50% of 30 min visit spent on counseling and coordination of care.     POE,DEIRDRE 02/01/2015

## 2015-02-02 LAB — HIV ANTIBODY (ROUTINE TESTING W REFLEX): HIV: NONREACTIVE

## 2015-02-03 LAB — CULTURE, URINE COMPREHENSIVE
COLONY COUNT: NO GROWTH
ORGANISM ID, BACTERIA: NO GROWTH

## 2015-02-04 LAB — GC/CHLAMYDIA PROBE AMP (~~LOC~~) NOT AT ARMC
Chlamydia: NEGATIVE
Neisseria Gonorrhea: NEGATIVE

## 2015-02-04 LAB — OBSTETRIC PANEL
ANTIBODY SCREEN: NEGATIVE
Basophils Absolute: 0 10*3/uL (ref 0.0–0.1)
Basophils Relative: 0 % (ref 0–1)
EOS ABS: 0 10*3/uL (ref 0.0–0.7)
EOS PCT: 0 % (ref 0–5)
HCT: 37.8 % (ref 36.0–46.0)
HEMOGLOBIN: 12.7 g/dL (ref 12.0–15.0)
Hepatitis B Surface Ag: NEGATIVE
LYMPHS ABS: 1.8 10*3/uL (ref 0.7–4.0)
LYMPHS PCT: 20 % (ref 12–46)
MCH: 29.9 pg (ref 26.0–34.0)
MCHC: 33.6 g/dL (ref 30.0–36.0)
MCV: 88.9 fL (ref 78.0–100.0)
MONO ABS: 0.7 10*3/uL (ref 0.1–1.0)
MPV: 10.1 fL (ref 8.6–12.4)
Monocytes Relative: 8 % (ref 3–12)
Neutro Abs: 6.6 10*3/uL (ref 1.7–7.7)
Neutrophils Relative %: 72 % (ref 43–77)
PLATELETS: 229 10*3/uL (ref 150–400)
RBC: 4.25 MIL/uL (ref 3.87–5.11)
RDW: 13.5 % (ref 11.5–15.5)
RH TYPE: POSITIVE
RUBELLA: 8.78 {index} — AB (ref <0.90)
WBC: 9.1 10*3/uL (ref 4.0–10.5)

## 2015-02-11 IMAGING — US US OB FOLLOW-UP
1 series · 12 of 28 positions shown · non-contrast
Comparison: none

[Series 1: us ob follow up · 12 of 56 slices shown]
[im 3/56]
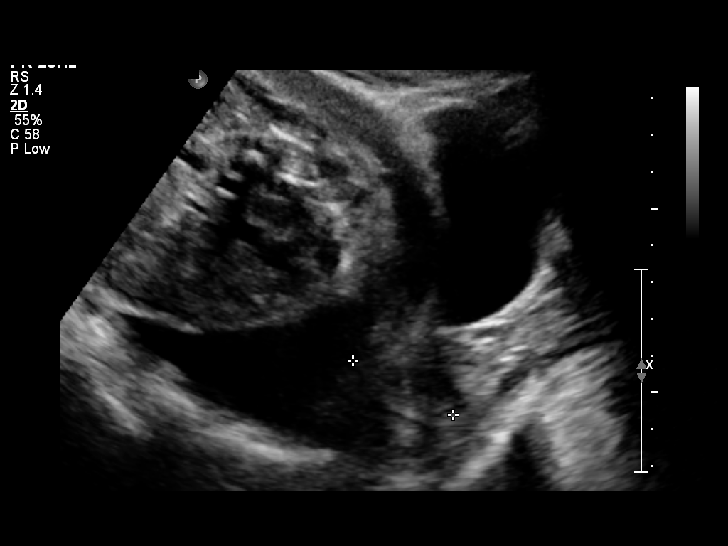
[im 7/56]
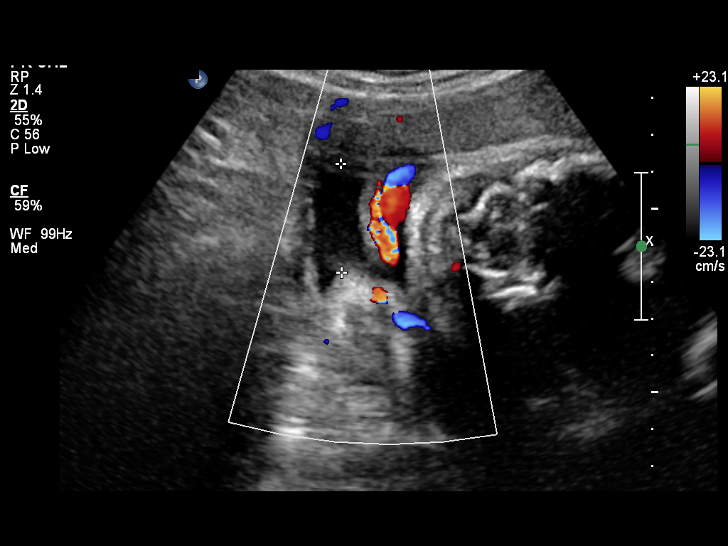
[im 11/56]
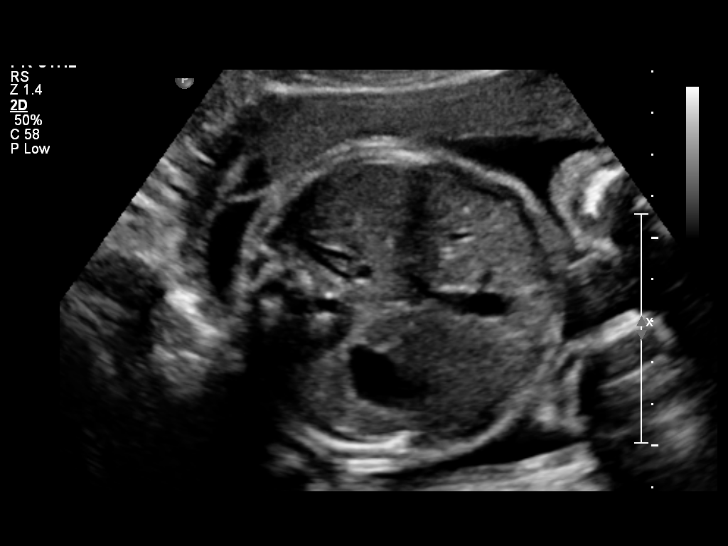
[im 17/56]
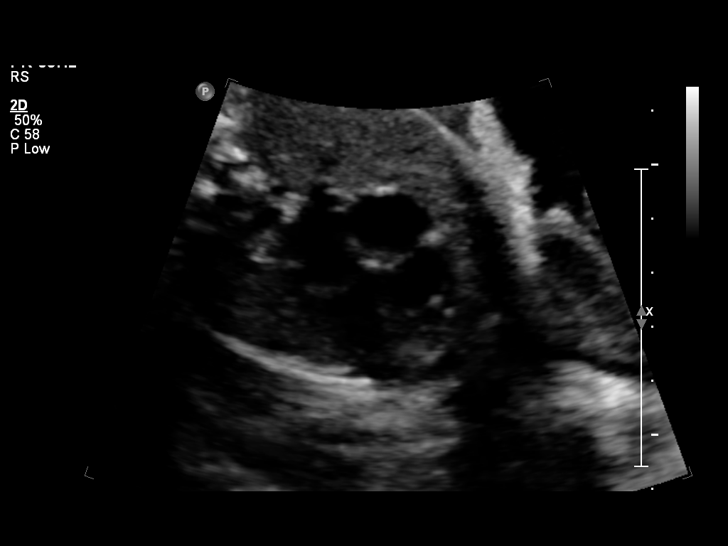
[im 21/56]
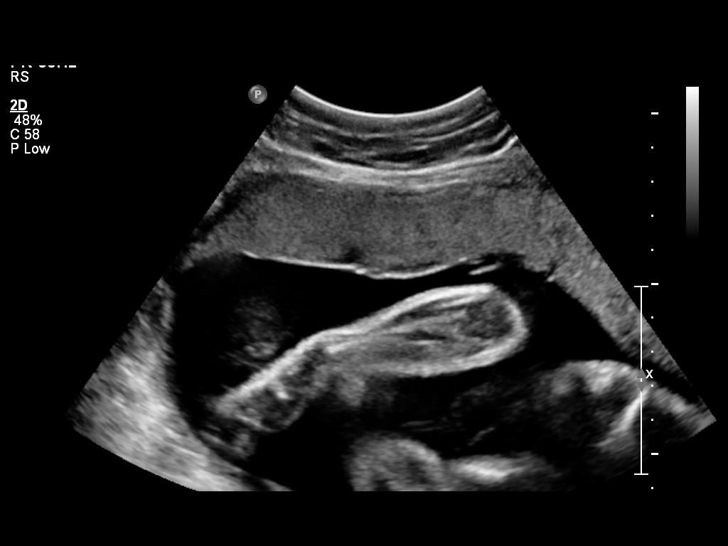
[im 25/56]
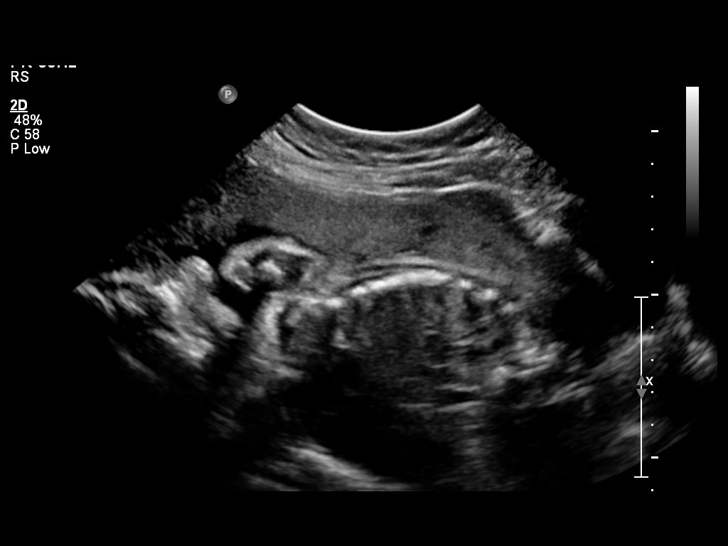
[im 31/56]
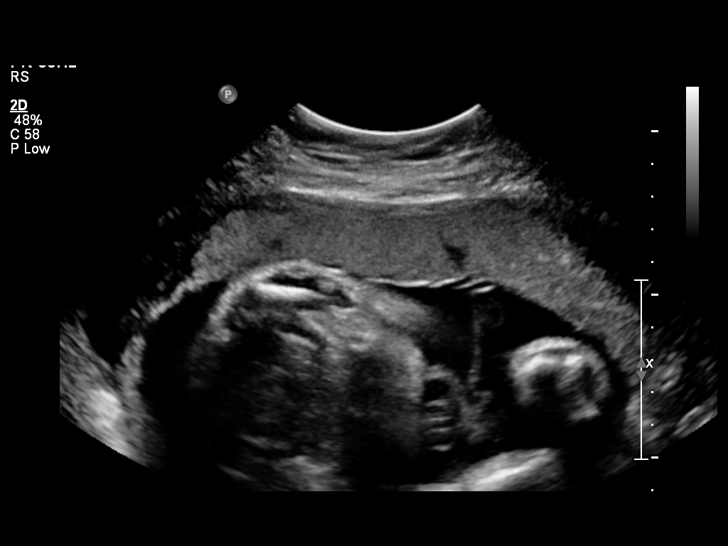
[im 35/56]
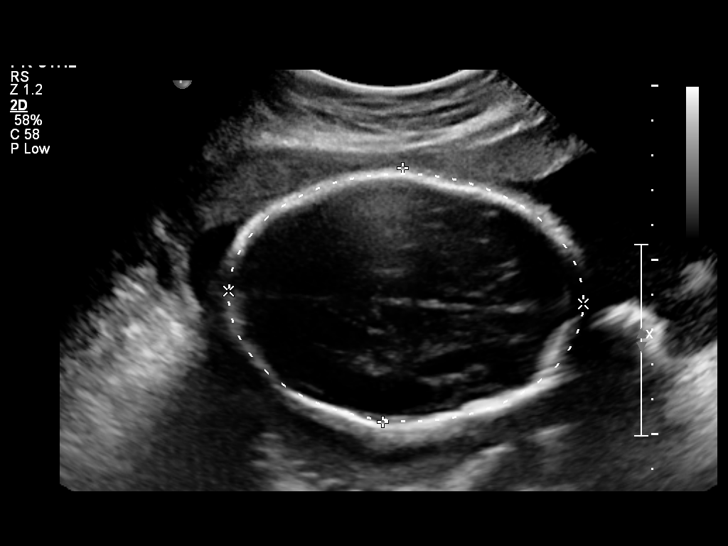
[im 39/56]
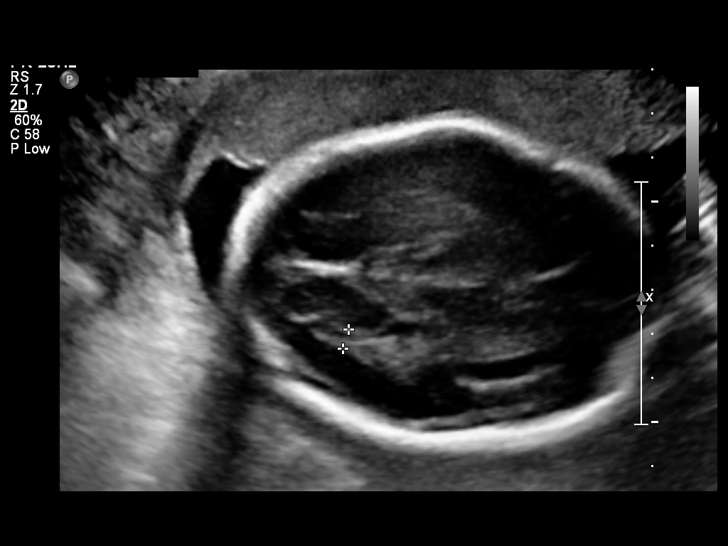
[im 45/56]
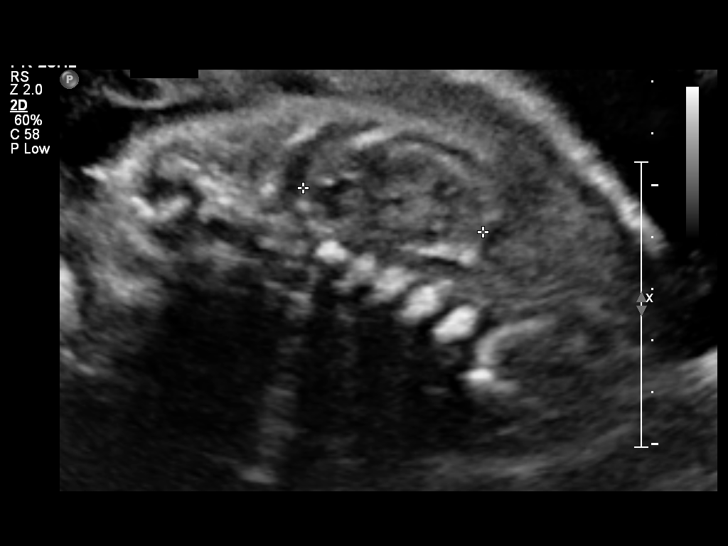
[im 49/56]
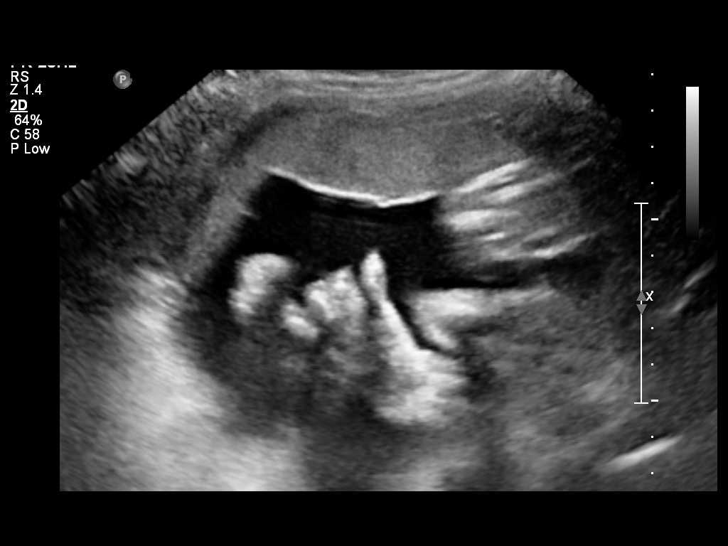
[im 53/56]
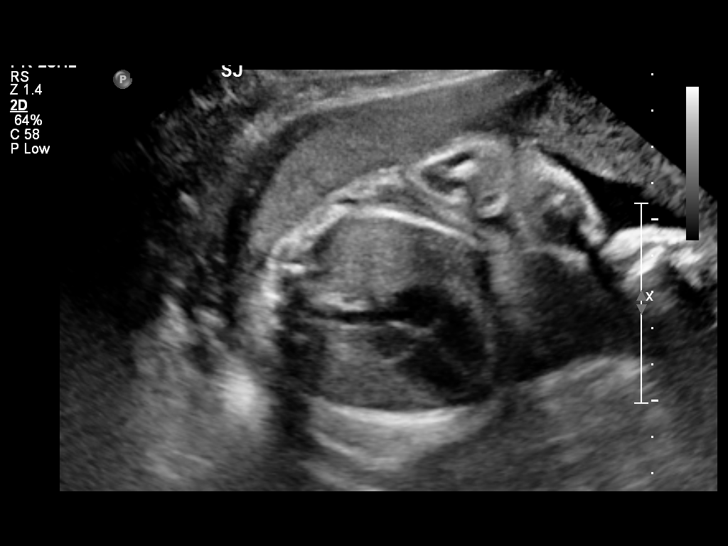

[12 of 28 positions shown; findings below may reference images not displayed]

OBSTETRICS REPORT
                      (Signed Final 05/29/2013 [DATE])

Service(s) Provided

 US OB FOLLOW UP                                       76816.1
Indications

 Hypertension - Gestational
Fetal Evaluation

 Num Of Fetuses:    1
 Fetal Heart Rate:  146                          bpm
 Cardiac Activity:  Observed
 Presentation:      Breech
 Placenta:          Anterior, above cervical os
 P. Cord            Visualized, central
 Insertion:

 Amniotic Fluid
 AFI FV:      Subjectively within normal limits
 AFI Sum:     13.96   cm       45  %Tile     Larg Pckt:    4.12  cm
 RUQ:   2.95    cm   RLQ:    3.38   cm    LUQ:   4.12    cm   LLQ:    3.51   cm
Biometry

 BPD:     71.6  mm     G. Age:  28w 5d                CI:        70.35   70 - 86
                                                      FL/HC:      21.0   18.8 -

 HC:     272.2  mm     G. Age:  29w 5d       68  %    HC/AC:      1.09   1.05 -

 AC:     248.8  mm     G. Age:  29w 1d       72  %    FL/BPD:     79.9   71 - 87
 FL:      57.2  mm     G. Age:  30w 0d       84  %    FL/AC:      23.0   20 - 24
 HUM:     51.6  mm     G. Age:  30w 1d       87  %

 Est. FW:    4274  gm      3 lb 1 oz     76  %
Gestational Age

 LMP:           28w 1d        Date:  11/13/12                 EDD:   08/20/13
 U/S Today:     29w 3d                                        EDD:   08/11/13
 Best:          28w 1d     Det. By:  LMP  (11/13/12)          EDD:   08/20/13
Anatomy

 Cranium:          Appears normal         Aortic Arch:      Not well visualized
 Fetal Cavum:      Previously seen        Ductal Arch:      Not well visualized
 Ventricles:       Appears normal         Diaphragm:        Previously seen
 Choroid Plexus:   Previously seen        Stomach:          Appears normal, left
                                                            sided
 Cerebellum:       Previously seen        Abdomen:          Previously seen
 Posterior Fossa:  Previously seen        Abdominal Wall:   Previously seen
 Nuchal Fold:      Previously seen        Cord Vessels:     Previously seen
 Face:             Orbits and profile     Kidneys:          Appear normal
                   previously seen
 Lips:             Previously seen        Bladder:          Appears normal
 Heart:            Appears normal         Spine:            Previously seen
                   (4CH, axis, and
                   situs)
 RVOT:             Previously seen        Lower             Previously seen
                                          Extremities:
 LVOT:             Previously seen        Upper             Previously seen
                                          Extremities:

 Other:  Fetus appears to be a female. Heels and 5th digit previously
         visualized. Nasal bone previously visualized. Technically difficult due
         to fetal position.
Cervix Uterus Adnexa

 Cervical Length:    3.09     cm

 Cervix:       Normal appearance by transabdominal scan.
 Uterus:       No abnormality visualized.
 Cul De Sac:   No free fluid seen.
 Left Ovary:    Not visualized. No adnexal mass visualized.
 Right Ovary:   Not visualized. No adnexal mass visualized.
 Adnexa:     No abnormality visualized.
Impression

 SIUP at 28+1 weeks
 Normal interval anatomy; anatomic survey complete except
 for arches
 Normal amniotic fluid volume
 Appropriate interval growth with EFW at the 76th %tile
Recommendations

 Follow-up as clinically indicated

 questions or concerns.

## 2015-03-01 ENCOUNTER — Encounter: Payer: Self-pay | Admitting: *Deleted

## 2015-03-01 ENCOUNTER — Ambulatory Visit (INDEPENDENT_AMBULATORY_CARE_PROVIDER_SITE_OTHER): Payer: BLUE CROSS/BLUE SHIELD | Admitting: Family

## 2015-03-01 VITALS — BP 126/84 | HR 93 | Wt 135.0 lb

## 2015-03-01 DIAGNOSIS — O34219 Maternal care for unspecified type scar from previous cesarean delivery: Secondary | ICD-10-CM | POA: Insufficient documentation

## 2015-03-01 DIAGNOSIS — O09211 Supervision of pregnancy with history of pre-term labor, first trimester: Secondary | ICD-10-CM

## 2015-03-01 DIAGNOSIS — Z98891 History of uterine scar from previous surgery: Secondary | ICD-10-CM

## 2015-03-01 NOTE — Progress Notes (Signed)
.   Subjective:  Victoria Lin is a 29 y.o. G2P0101 at 2522w4d being seen today for ongoing prenatal care.  She is currently monitored for the following issues for this high-risk pregnancy and has Chronic headaches; History of cesarean section; Previous preterm delivery in first trimester, antepartum; History of gestational hypertension; and Previous cesarean delivery affecting pregnancy, antepartum on her problem list.  Patient reports no complaints.   . Vag. Bleeding: None.   . Denies leaking of fluid.   The following portions of the patient's history were reviewed and updated as appropriate: allergies, current medications, past family history, past medical history, past social history, past surgical history and problem list. Problem list updated.  Objective:   Filed Vitals:   03/01/15 1113  BP: 126/84  Pulse: 93  Weight: 135 lb (61.236 kg)    Fetal Status: Fetal Heart Rate (bpm): 169 Fundal Height: 12 cm       General:  Alert, oriented and cooperative. Patient is in no acute distress.  Skin: Skin is warm and dry. No rash noted.   Cardiovascular: Normal heart rate noted  Respiratory: Normal respiratory effort, no problems with respiration noted  Abdomen: Soft, gravid, appropriate for gestational age.       Pelvic: Vag. Bleeding: None Vag D/C Character: Thin   Cervical exam deferred        Extremities: Normal range of motion.  Edema: None  Mental Status: Normal mood and affect. Normal behavior. Normal judgment and thought content.   Urinalysis: Urine Protein: Negative Urine Glucose: Negative  Assessment and Plan:  Pregnancy: G2P0101 at 3922w4d  1. History of cesarean section - Desires TOLAC, consent signed today  2. Previous preterm delivery in first trimester, antepartum - Declined 17-p - Reviewed initial prenatal labs  General obstetric precautions including but not limited to vaginal bleeding and pelvic pain reviewed in detail with the patient.  Return in about 4 weeks  (around 03/29/2015).   Eino FarberWalidah Kennith GainN Karim, CNM

## 2015-03-21 IMAGING — US US OB FOLLOW-UP
1 series · 12 of 28 positions shown · non-contrast
Comparison: none

[Series 1: us ob follow-up · 0.12mm/px · 12 of 30 slices shown]
[im 2/30]
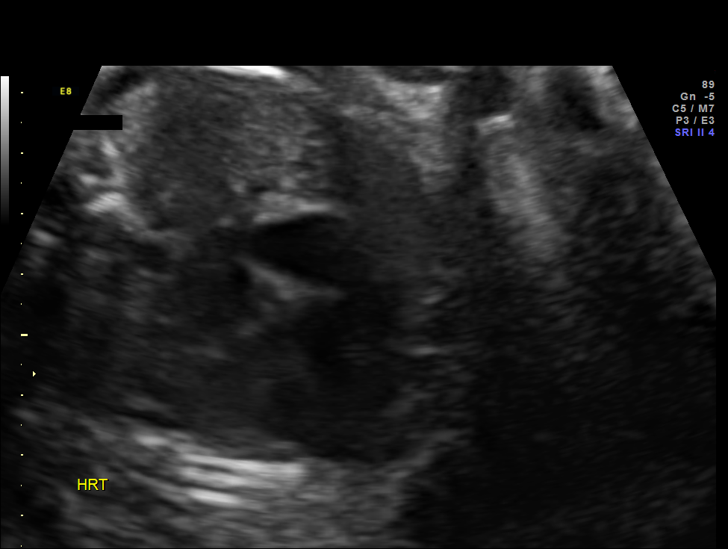
[im 4/30]
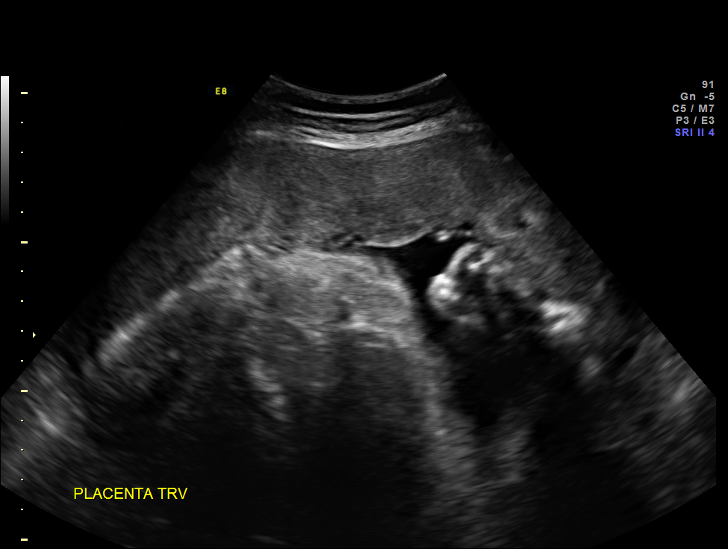
[im 6/30]
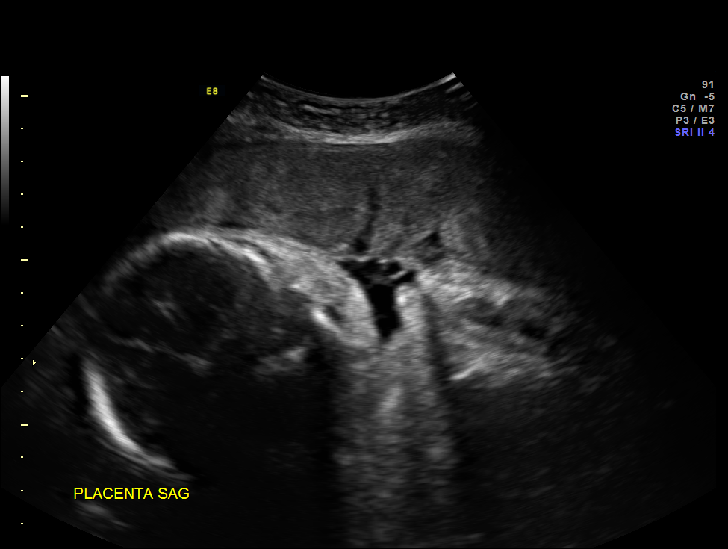
[im 9/30]
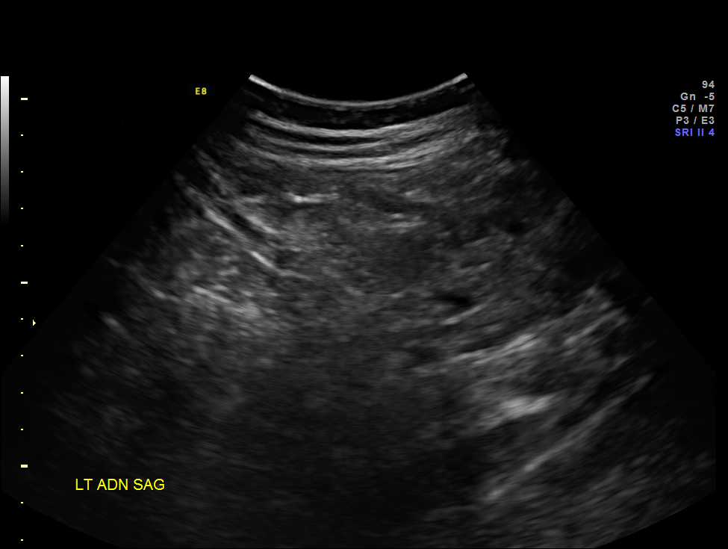
[im 11/30]
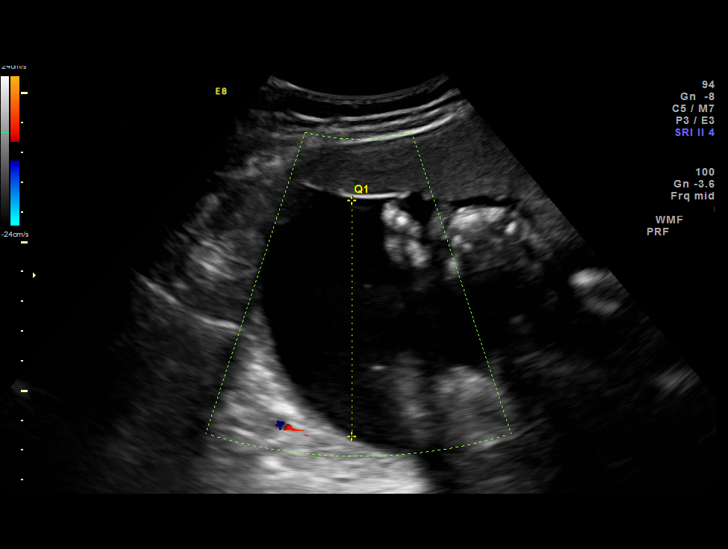
[im 13/30]
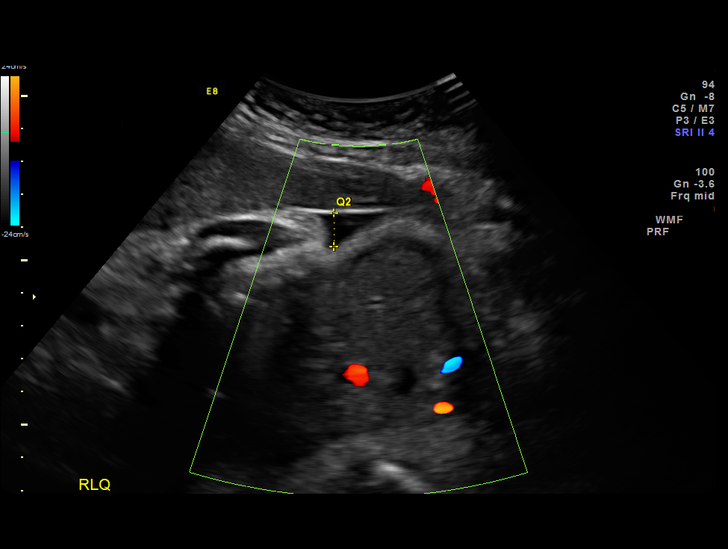
[im 17/30]
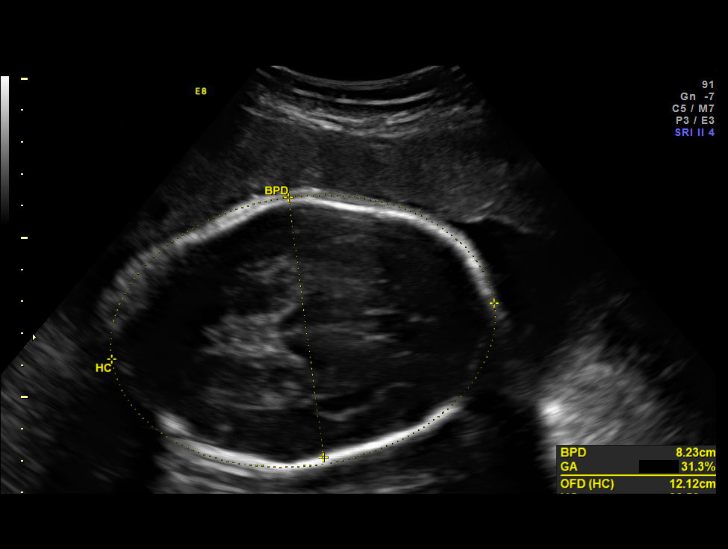
[im 19/30]
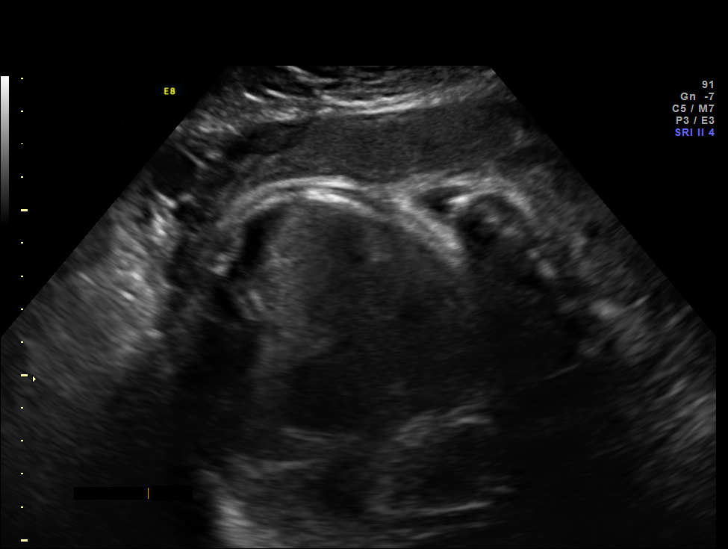
[im 21/30]
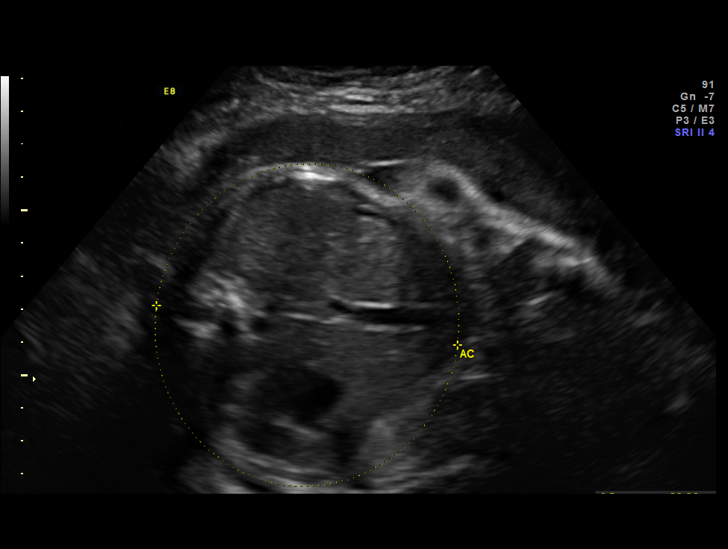
[im 24/30]
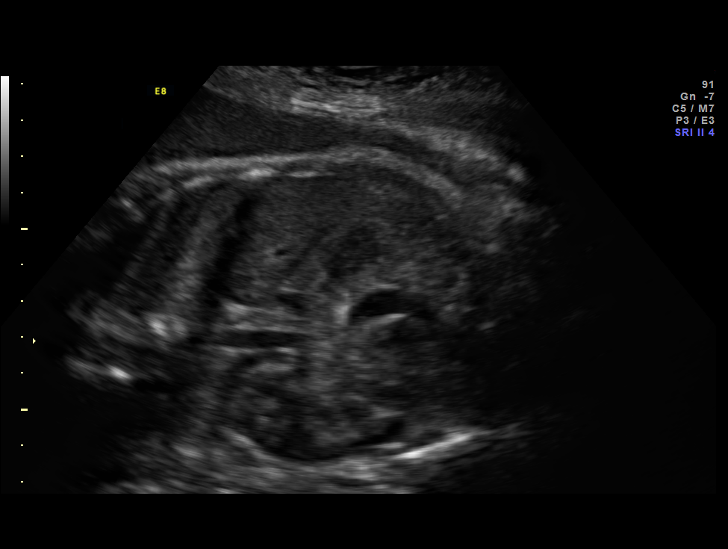
[im 26/30]
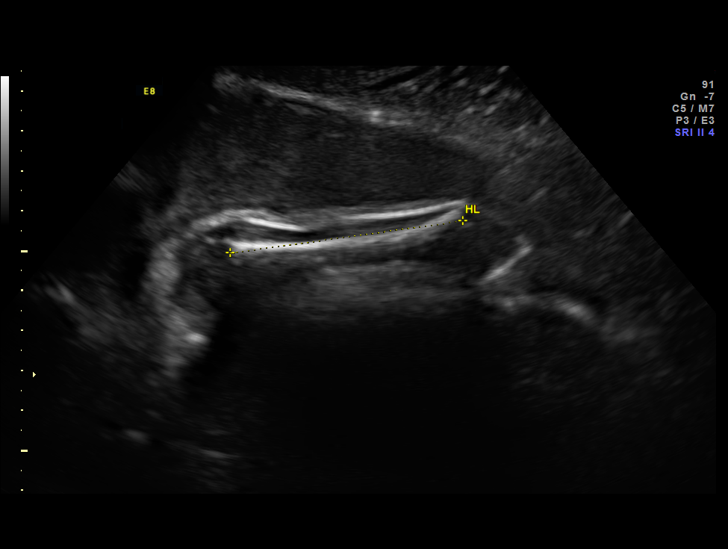
[im 28/30]
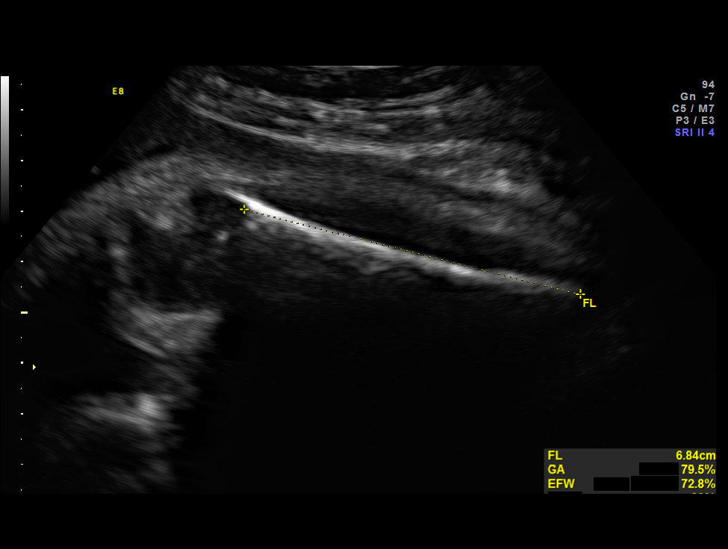

[12 of 28 positions shown; findings below may reference images not displayed]

OBSTETRICS REPORT
                      (Signed Final 07/06/2013 [DATE])

Service(s) Provided

 US OB FOLLOW UP                                       76816.1
Indications

 Hypertension - Gestational
Fetal Evaluation

 Num Of Fetuses:    1
 Fetal Heart Rate:  159                          bpm
 Cardiac Activity:  Observed
 Presentation:      Frank breech
 Placenta:          Anterior, above cervical os
 P. Cord            Previously Visualized
 Insertion:

 Amniotic Fluid
 AFI FV:      Subjectively within normal limits
 AFI Sum:     11.38   cm       29  %Tile     Larg Pckt:    7.94  cm
 RUQ:   7.94    cm   LUQ:    1.02   cm    LLQ:   2.42    cm
Biophysical Evaluation

 Amniotic F.V:   Pocket => 2 cm two         F. Tone:        Observed
                 planes
 F. Movement:    Observed                   Score:          [DATE]
 F. Breathing:   Observed
Biometry

 BPD:     82.7  mm     G. Age:  33w 2d                CI:         68.5   70 - 86
 OFD:    120.7  mm                                    FL/HC:      21.1   19.4 -

 HC:     325.8  mm     G. Age:  37w 0d       91  %    HC/AC:      1.09   0.96 -

 AC:     299.5  mm     G. Age:  34w 0d       62  %    FL/BPD:     83.1   71 - 87
 FL:      68.7  mm     G. Age:  35w 2d       81  %    FL/AC:      22.9   20 - 24
 HUM:     59.2  mm     G. Age:  34w 2d       73  %

 Est. FW:    2260  gm      5 lb 7 oz     77  %
Gestational Age
 LMP:           33w 4d        Date:  11/13/12                 EDD:   08/20/13
 U/S Today:     34w 6d                                        EDD:   08/11/13
 Best:          33w 4d     Det. By:  LMP  (11/13/12)          EDD:   08/20/13
Anatomy

 Cranium:          Appears normal         Aortic Arch:      Not well visualized
 Fetal Cavum:      Appears normal         Ductal Arch:      Not well visualized
 Ventricles:       Appears normal         Diaphragm:        Previously seen
 Choroid Plexus:   Previously seen        Stomach:          Appears normal, left
                                                            sided
 Cerebellum:       Previously seen        Abdomen:          Appears normal
 Posterior Fossa:  Previously seen        Abdominal Wall:   Previously seen
 Nuchal Fold:      Previously seen        Cord Vessels:     Previously seen
 Face:             Orbits and profile     Kidneys:          Appear normal
                   previously seen
 Lips:             Previously seen        Bladder:          Appears normal
 Heart:            Appears normal         Spine:            Previously seen
                   (4CH, axis, and
                   situs)
 RVOT:             Previously seen        Lower             Previously seen
                                          Extremities:
 LVOT:             Previously seen        Upper             Previously seen
                                          Extremities:

 Other:  Female gender previously seen. Heels and 5th digit previously
         visualized. Nasal bone previously visualized. Technically difficult due
         to fetal position.
Cervix Uterus Adnexa

 Cervix:       Not visualized (advanced GA >12wks)

 Adnexa:     No abnormality visualized.
Impression

 Single living intrauterine pregnancy at 33 weeks 4 days.
 Appropriate interval fetal growth (77%).
 Normal amniotic fluid volume.
 Normal interval fetal anatomy.
 BPP [DATE].
Recommendations

 Recommend follow-up ultrasound examination in 4 weeks to
 reassess fetal growth.

 questions or concerns.
                Zenfant, Deelun

## 2015-03-29 ENCOUNTER — Ambulatory Visit (INDEPENDENT_AMBULATORY_CARE_PROVIDER_SITE_OTHER): Payer: BLUE CROSS/BLUE SHIELD | Admitting: Advanced Practice Midwife

## 2015-03-29 VITALS — BP 115/78 | HR 93 | Wt 138.0 lb

## 2015-03-29 DIAGNOSIS — Z3482 Encounter for supervision of other normal pregnancy, second trimester: Secondary | ICD-10-CM

## 2015-03-29 DIAGNOSIS — O34219 Maternal care for unspecified type scar from previous cesarean delivery: Secondary | ICD-10-CM

## 2015-03-29 DIAGNOSIS — Z3480 Encounter for supervision of other normal pregnancy, unspecified trimester: Secondary | ICD-10-CM | POA: Insufficient documentation

## 2015-03-29 NOTE — Progress Notes (Signed)
Subjective:  Victoria Lin is a 30 y.o. G2P0101 at 5463w4d being seen today for ongoing prenatal care.  She is currently monitored for the following issues for this low-risk pregnancy and has Chronic headaches; Previous preterm delivery in first trimester, antepartum; History of gestational hypertension; Previous cesarean delivery affecting pregnancy, antepartum; and Supervision of normal subsequent pregnancy on her problem list.  Patient reports no complaints.   . Vag. Bleeding: None.   . Denies leaking of fluid.   The following portions of the patient's history were reviewed and updated as appropriate: allergies, current medications, past family history, past medical history, past social history, past surgical history and problem list. Problem list updated.  Objective:   Filed Vitals:   03/29/15 1046  BP: 140/79  Pulse: 93  Weight: 138 lb (62.596 kg)    Fetal Status: Fetal Heart Rate (bpm): 159         General:  Alert, oriented and cooperative. Patient is in no acute distress.  Skin: Skin is warm and dry. No rash noted.   Cardiovascular: Normal heart rate noted  Respiratory: Normal respiratory effort, no problems with respiration noted  Abdomen: Soft, gravid, appropriate for gestational age.       Pelvic: Vag. Bleeding: None Vag D/C Character: Thin   Cervical exam deferred        Extremities: Normal range of motion.  Edema: None  Mental Status: Normal mood and affect. Normal behavior. Normal judgment and thought content.   Urinalysis: Urine Protein: Negative Urine Glucose: Negative  Assessment and Plan:  Pregnancy: G2P0101 at 5763w4d  1. Encounter for supervision of other normal pregnancy in second trimester  - US MFM OB COMP + 14 WK; Future  2. Previous cesarean delivery affecting pregnancy, antepartum --For Breech with PPROM at 35 weeks.  Desires TOLAC.  Consent signed.  Preterm labor symptoms and general obstetric precautions including but not limited to vaginal bleeding,  contractions, leaking of fluid and fetal movement were reviewed in detail with the patient. Please refer to After Visit Summary for other counseling recommendations.  Return in about 4 weeks (around 04/26/2015).   Hurshel PartyLisa A Leftwich-Kirby, CNM

## 2015-04-18 ENCOUNTER — Other Ambulatory Visit: Payer: Self-pay | Admitting: Advanced Practice Midwife

## 2015-04-18 DIAGNOSIS — Z8759 Personal history of other complications of pregnancy, childbirth and the puerperium: Secondary | ICD-10-CM

## 2015-04-18 DIAGNOSIS — O09892 Supervision of other high risk pregnancies, second trimester: Secondary | ICD-10-CM

## 2015-04-18 DIAGNOSIS — Z3689 Encounter for other specified antenatal screening: Secondary | ICD-10-CM

## 2015-04-18 DIAGNOSIS — Z3A18 18 weeks gestation of pregnancy: Secondary | ICD-10-CM

## 2015-04-18 DIAGNOSIS — O09212 Supervision of pregnancy with history of pre-term labor, second trimester: Secondary | ICD-10-CM

## 2015-04-18 DIAGNOSIS — O34219 Maternal care for unspecified type scar from previous cesarean delivery: Secondary | ICD-10-CM

## 2015-04-19 ENCOUNTER — Ambulatory Visit (HOSPITAL_COMMUNITY)
Admission: RE | Admit: 2015-04-19 | Discharge: 2015-04-19 | Disposition: A | Discharge status: Home / Self Care | Payer: BLUE CROSS/BLUE SHIELD | Source: Ambulatory Visit | Attending: Advanced Practice Midwife | Admitting: Advanced Practice Midwife

## 2015-04-19 DIAGNOSIS — O09292 Supervision of pregnancy with other poor reproductive or obstetric history, second trimester: Secondary | ICD-10-CM | POA: Diagnosis not present

## 2015-04-19 DIAGNOSIS — O34219 Maternal care for unspecified type scar from previous cesarean delivery: Secondary | ICD-10-CM | POA: Diagnosis present

## 2015-04-19 DIAGNOSIS — Z3689 Encounter for other specified antenatal screening: Secondary | ICD-10-CM

## 2015-04-19 DIAGNOSIS — Z3A18 18 weeks gestation of pregnancy: Secondary | ICD-10-CM | POA: Insufficient documentation

## 2015-04-19 DIAGNOSIS — Z36 Encounter for antenatal screening of mother: Secondary | ICD-10-CM | POA: Insufficient documentation

## 2015-04-19 DIAGNOSIS — O09892 Supervision of other high risk pregnancies, second trimester: Secondary | ICD-10-CM

## 2015-04-19 DIAGNOSIS — Z3482 Encounter for supervision of other normal pregnancy, second trimester: Secondary | ICD-10-CM

## 2015-04-19 DIAGNOSIS — Z8759 Personal history of other complications of pregnancy, childbirth and the puerperium: Secondary | ICD-10-CM

## 2015-04-19 DIAGNOSIS — O09212 Supervision of pregnancy with history of pre-term labor, second trimester: Secondary | ICD-10-CM

## 2015-04-19 NOTE — Addendum Note (Signed)
Encounter addended by: Hurshel Party, CNM on: 04/19/2015  8:01 PM<BR>     Documentation filed: Problem List

## 2015-04-26 ENCOUNTER — Ambulatory Visit (INDEPENDENT_AMBULATORY_CARE_PROVIDER_SITE_OTHER): Payer: BLUE CROSS/BLUE SHIELD | Admitting: Family

## 2015-04-26 ENCOUNTER — Encounter: Payer: Self-pay | Admitting: Family

## 2015-04-26 VITALS — BP 130/83 | HR 80 | Wt 143.0 lb

## 2015-04-26 DIAGNOSIS — Z23 Encounter for immunization: Secondary | ICD-10-CM

## 2015-04-26 DIAGNOSIS — Z3482 Encounter for supervision of other normal pregnancy, second trimester: Secondary | ICD-10-CM

## 2015-04-26 NOTE — Progress Notes (Signed)
Subjective:  Victoria Lin is a 30 y.o. G2P0101 at [redacted]w[redacted]d being seen today for ongoing prenatal care.  She is currently monitored for the following issues for this low-risk pregnancy and has Chronic headaches; Previous preterm delivery in first trimester, antepartum; History of gestational hypertension; Previous cesarean delivery affecting pregnancy, antepartum; and Supervision of normal subsequent pregnancy on her problem list.  Patient reports no complaints.  Contractions: Not present. Vag. Bleeding: None.  Movement: Present. Denies leaking of fluid.   The following portions of the patient's history were reviewed and updated as appropriate: allergies, current medications, past family history, past medical history, past social history, past surgical history and problem list. Problem list updated.  Objective:   Filed Vitals:   04/26/15 1108  BP: 130/83  Pulse: 80  Weight: 143 lb (64.864 kg)    Fetal Status: Fetal Heart Rate (bpm): 154 Fundal Height: 19 cm Movement: Present     General:  Alert, oriented and cooperative. Patient is in no acute distress.  Skin: Skin is warm and dry. No rash noted.   Cardiovascular: Normal heart rate noted  Respiratory: Normal respiratory effort, no problems with respiration noted  Abdomen: Soft, gravid, appropriate for gestational age. Pain/Pressure: Absent     Pelvic: Vag. Bleeding: None Vag D/C Character: Thin   Cervical exam deferred        Extremities: Normal range of motion.  Edema: None  Mental Status: Normal mood and affect. Normal behavior. Normal judgment and thought content.   Urinalysis: Urine Protein: Negative Urine Glucose: Negative  Assessment and Plan:  Pregnancy: G2P0101 at [redacted]w[redacted]d  1. Immunization due - Flu Vaccine QUAD 36+ mos IM (Fluarix, Quad PF)  2. Encounter for supervision of other normal pregnancy in second trimester - Reviewed anatomy ultrasound  General obstetric precautions including but not limited to vaginal bleeding  and pelvic pain reviewed in detail with the patient.  Please refer to After Visit Summary for other counseling recommendations.  Return in about 4 weeks (around 05/24/2015).   Eino Farber Kennith Gain, CNM

## 2015-05-24 ENCOUNTER — Ambulatory Visit (INDEPENDENT_AMBULATORY_CARE_PROVIDER_SITE_OTHER): Payer: BLUE CROSS/BLUE SHIELD | Admitting: Family

## 2015-05-24 VITALS — BP 121/74 | HR 85 | Wt 149.0 lb

## 2015-05-24 DIAGNOSIS — Z3482 Encounter for supervision of other normal pregnancy, second trimester: Secondary | ICD-10-CM

## 2015-05-24 DIAGNOSIS — O09211 Supervision of pregnancy with history of pre-term labor, first trimester: Secondary | ICD-10-CM

## 2015-05-24 NOTE — Progress Notes (Signed)
Subjective:  Victoria Lin is a 30 y.o. G2P0101 at 5558w4d being seen today for ongoing prenatal care.  She is currently monitored for the following issues for this high-risk pregnancy and has Chronic headaches; Previous preterm delivery in second trimester, antepartum; History of gestational hypertension; Previous cesarean delivery affecting pregnancy, antepartum; and Supervision of normal subsequent pregnancy on her problem list.  Patient reports no complaints.  Contractions: Not present. Vag. Bleeding: None.  Movement: Present. Denies leaking of fluid.   The following portions of the patient's history were reviewed and updated as appropriate: allergies, current medications, past family history, past medical history, past social history, past surgical history and problem list. Problem list updated.  Objective:   Filed Vitals:   05/24/15 1016  BP: 121/74  Pulse: 85  Weight: 149 lb (67.586 kg)    Fetal Status: Fetal Heart Rate (bpm): 154 Fundal Height: 23 cm Movement: Present     General:  Alert, oriented and cooperative. Patient is in no acute distress.  Skin: Skin is warm and dry. No rash noted.   Cardiovascular: Normal heart rate noted  Respiratory: Normal respiratory effort, no problems with respiration noted  Abdomen: Soft, gravid, appropriate for gestational age. Pain/Pressure: Absent     Pelvic: Vag. Bleeding: None Vag D/C Character: Thin   Cervical exam deferred        Extremities: Normal range of motion.  Edema: None  Mental Status: Normal mood and affect. Normal behavior. Normal judgment and thought content.   Urinalysis: Urine Protein: Negative Urine Glucose: Negative  Assessment and Plan:  Pregnancy: G2P0101 at 7058w4d  1. Encounter for supervision of other normal pregnancy in second trimester - Reviewed third trimester labs with patient for next visit.    2. Previous preterm delivery in first trimester, antepartum - Declined 17-p  Preterm labor symptoms and general  obstetric precautions including but not limited to vaginal bleeding, contractions, leaking of fluid and fetal movement were reviewed in detail with the patient. Please refer to After Visit Summary for other counseling recommendations.   No Follow-up on file.   Eino FarberWalidah Kennith GainN Karim, CNM

## 2015-06-21 ENCOUNTER — Ambulatory Visit (INDEPENDENT_AMBULATORY_CARE_PROVIDER_SITE_OTHER): Payer: BLUE CROSS/BLUE SHIELD | Admitting: Advanced Practice Midwife

## 2015-06-21 VITALS — BP 128/72 | HR 100 | Wt 152.0 lb

## 2015-06-21 DIAGNOSIS — Z36 Encounter for antenatal screening of mother: Secondary | ICD-10-CM

## 2015-06-21 DIAGNOSIS — Z23 Encounter for immunization: Secondary | ICD-10-CM | POA: Diagnosis not present

## 2015-06-21 DIAGNOSIS — Z3482 Encounter for supervision of other normal pregnancy, second trimester: Secondary | ICD-10-CM

## 2015-06-21 DIAGNOSIS — Z3490 Encounter for supervision of normal pregnancy, unspecified, unspecified trimester: Secondary | ICD-10-CM

## 2015-06-21 DIAGNOSIS — O2212 Genital varices in pregnancy, second trimester: Secondary | ICD-10-CM

## 2015-06-21 LAB — CBC
HEMATOCRIT: 38.5 % (ref 35.0–45.0)
HEMOGLOBIN: 12.8 g/dL (ref 11.7–15.5)
MCH: 30.5 pg (ref 27.0–33.0)
MCHC: 33.2 g/dL (ref 32.0–36.0)
MCV: 91.9 fL (ref 80.0–100.0)
MPV: 10.1 fL (ref 7.5–12.5)
Platelets: 214 10*3/uL (ref 140–400)
RBC: 4.19 MIL/uL (ref 3.80–5.10)
RDW: 13.4 % (ref 11.0–15.0)
WBC: 9.1 10*3/uL (ref 3.8–10.8)

## 2015-06-21 NOTE — Progress Notes (Signed)
Patient complaining about vaginal swelling. Armandina StammerJennifer Howard RN BSN

## 2015-06-21 NOTE — Progress Notes (Signed)
Subjective:  Victoria Lin is a 30 y.o. G2P0101 at 6620w4d being seen today for ongoing prenatal care.  She is currently monitored for the following issues for this high-risk pregnancy and has Chronic headaches; Previous preterm delivery in first trimester, antepartum; History of gestational hypertension; Previous cesarean delivery affecting pregnancy, antepartum; and Supervision of normal subsequent pregnancy on her problem list.  Patient reports vulvar swelling.  Contractions: Not present. Vag. Bleeding: None.  Movement: Present. Denies leaking of fluid.   The following portions of the patient's history were reviewed and updated as appropriate: allergies, current medications, past family history, past medical history, past social history, past surgical history and problem list. Problem list updated.  Objective:   Filed Vitals:   06/21/15 0853  BP: 128/72  Pulse: 100  Weight: 152 lb (68.947 kg)    Fetal Status: Fetal Heart Rate (bpm): 157   Movement: Present     General:  Alert, oriented and cooperative. Patient is in no acute distress.  Skin: Skin is warm and dry. No rash noted.   Cardiovascular: Normal heart rate noted  Respiratory: Normal respiratory effort, no problems with respiration noted  Abdomen: Soft, gravid, appropriate for gestational age. Pain/Pressure: Absent     Pelvic: Vag. Bleeding: None Vag D/C Character: Thin   Cervical exam deferred        Two small vulvar varicosities on left labia majora.   Extremities: Normal range of motion.  Edema: None  Mental Status: Normal mood and affect. Normal behavior. Normal judgment and thought content.   Urinalysis: Urine Protein: Negative Urine Glucose: Trace  Assessment and Plan:  Pregnancy: G2P0101 at 8420w4d  1. Prenatal care, unspecified trimester  - CBC - HIV antibody (with reflex) - Glucose Tolerance, 1 HR (50g)  2. Encounter for supervision of other normal pregnancy in second trimester   3. 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   Preterm labor symptoms and general obstetric precautions including but not limited to vaginal bleeding, contractions, leaking of fluid and fetal movement were reviewed in detail with the patient. Please refer to After Visit Summary for other counseling recommendations.  F/U 2 weeks   Dorathy KinsmanVirginia Timber Lucarelli, [place]

## 2015-06-21 NOTE — Patient Instructions (Signed)
Tdap Vaccine (Tetanus, Diphtheria and Pertussis): What You Need to Know 1. Why get vaccinated? Tetanus, diphtheria and pertussis are very serious diseases. Tdap vaccine can protect us from these diseases. And, Tdap vaccine given to pregnant women can protect newborn babies against pertussis. TETANUS (Lockjaw) is rare in the [place] today. It causes painful muscle tightening and stiffness, usually all over the body.  It can lead to tightening of muscles in the head and neck so you can't open your mouth, swallow, or sometimes even breathe. Tetanus kills about 1 out of 10 people who are infected even after receiving the best medical care. DIPHTHERIA is also rare in the [place] today. It can cause a thick coating to form in the back of the throat.  It can lead to breathing problems, heart failure, paralysis, and death. PERTUSSIS (Whooping Cough) causes severe coughing spells, which can cause difficulty breathing, vomiting and disturbed sleep.  It can also lead to weight loss, incontinence, and rib fractures. Up to 2 in 100 adolescents and 5 in 100 adults with pertussis are hospitalized or have complications, which could include pneumonia or death. These diseases are caused by bacteria. Diphtheria and pertussis are spread from person to person through secretions from coughing or sneezing. Tetanus enters the body through cuts, scratches, or wounds. Before vaccines, as many as 200,000 cases of diphtheria, 200,000 cases of pertussis, and hundreds of cases of tetanus, were reported in the [place] each year. Since vaccination began, reports of cases for tetanus and diphtheria have dropped by about 99% and for pertussis by about 80%. 2. Tdap vaccine Tdap vaccine can protect adolescents and adults from tetanus, diphtheria, and pertussis. One dose of Tdap is routinely given at age 11 or 12. People who did not get Tdap at that age should get it as soon as possible. Tdap is especially important  for healthcare professionals and anyone having close contact with a baby younger than 12 months. Pregnant women should get a dose of Tdap during every pregnancy, to protect the newborn from pertussis. Infants are most at risk for severe, life-threatening complications from pertussis. Another vaccine, called Td, protects against tetanus and diphtheria, but not pertussis. A Td booster should be given every 10 years. Tdap may be given as one of these boosters if you have never gotten Tdap before. Tdap may also be given after a severe cut or burn to prevent tetanus infection. Your doctor or the person giving you the vaccine can give you more information. Tdap may safely be given at the same time as other vaccines. 3. Some people should not get this vaccine  A person who has ever had a life-threatening allergic reaction after a previous dose of any diphtheria, tetanus or pertussis containing vaccine, OR has a severe allergy to any part of this vaccine, should not get Tdap vaccine. Tell the person giving the vaccine about any severe allergies.  Anyone who had coma or long repeated seizures within 7 days after a childhood dose of DTP or DTaP, or a previous dose of Tdap, should not get Tdap, unless a cause other than the vaccine was found. They can still get Td.  Talk to your doctor if you:  have seizures or another nervous system problem,  had severe pain or swelling after any vaccine containing diphtheria, tetanus or pertussis,  ever had a condition called Guillain-Barr Syndrome (GBS),  aren't feeling well on the day the shot is scheduled. 4. Risks With any medicine, including vaccines, there is   a chance of side effects. These are usually mild and go away on their own. Serious reactions are also possible but are rare. Most people who get Tdap vaccine do not have any problems with it. Mild problems following Tdap (Did not interfere with activities)  Pain where the shot was given (about 3 in 4  adolescents or 2 in 3 adults)  Redness or swelling where the shot was given (about 1 person in 5)  Mild fever of at least 100.4F (up to about 1 in 25 adolescents or 1 in 100 adults)  Headache (about 3 or 4 people in 10)  Tiredness (about 1 person in 3 or 4)  Nausea, vomiting, diarrhea, stomach ache (up to 1 in 4 adolescents or 1 in 10 adults)  Chills, sore joints (about 1 person in 10)  Body aches (about 1 person in 3 or 4)  Rash, swollen glands (uncommon) Moderate problems following Tdap (Interfered with activities, but did not require medical attention)  Pain where the shot was given (up to 1 in 5 or 6)  Redness or swelling where the shot was given (up to about 1 in 16 adolescents or 1 in 12 adults)  Fever over 102F (about 1 in 100 adolescents or 1 in 250 adults)  Headache (about 1 in 7 adolescents or 1 in 10 adults)  Nausea, vomiting, diarrhea, stomach ache (up to 1 or 3 people in 100)  Swelling of the entire arm where the shot was given (up to about 1 in 500). Severe problems following Tdap (Unable to perform usual activities; required medical attention)  Swelling, severe pain, bleeding and redness in the arm where the shot was given (rare). Problems that could happen after any vaccine:  People sometimes faint after a medical procedure, including vaccination. Sitting or lying down for about 15 minutes can help prevent fainting, and injuries caused by a fall. Tell your doctor if you feel dizzy, or have vision changes or ringing in the ears.  Some people get severe pain in the shoulder and have difficulty moving the arm where a shot was given. This happens very rarely.  Any medication can cause a severe allergic reaction. Such reactions from a vaccine are very rare, estimated at fewer than 1 in a million doses, and would happen within a few minutes to a few hours after the vaccination. As with any medicine, there is a very remote chance of a vaccine causing a serious  injury or death. The safety of vaccines is always being monitored. For more information, visit: www.cdc.gov/vaccinesafety/ 5. What if there is a serious problem? What should I look for?  Look for anything that concerns you, such as signs of a severe allergic reaction, very high fever, or unusual behavior.  Signs of a severe allergic reaction can include hives, swelling of the face and throat, difficulty breathing, a fast heartbeat, dizziness, and weakness. These would usually start a few minutes to a few hours after the vaccination. What should I do?  If you think it is a severe allergic reaction or other emergency that can't wait, call 9-1-1 or get the person to the nearest hospital. Otherwise, call your doctor.  Afterward, the reaction should be reported to the Vaccine Adverse Event Reporting System (VAERS). Your doctor might file this report, or you can do it yourself through the VAERS web site at www.vaers.hhs.gov, or by calling 1-800-822-7967. VAERS does not give medical advice.  6. The National Vaccine Injury Compensation Program The National Vaccine Injury Compensation Program (  VICP) is a federal program that was created to compensate people who may have been injured by certain vaccines. Persons who believe they may have been injured by a vaccine can learn about the program and about filing a claim by calling 1-800-338-2382 or visiting the VICP website at www.hrsa.gov/vaccinecompensation. There is a time limit to file a claim for compensation. 7. How can I learn more?  Ask your doctor. He or she can give you the vaccine package insert or suggest other sources of information.  Call your local or state health department.  Contact the Centers for Disease Control and Prevention (CDC):  Call 1-800-232-4636 (1-800-CDC-INFO) or  Visit CDC's website at www.cdc.gov/vaccines CDC Tdap Vaccine VIS (05/09/13)   This information is not intended to replace advice given to you by your health care  provider. Make sure you discuss any questions you have with your health care provider.   Document Released: 09/01/2011 Document Revised: 03/23/2014 Document Reviewed: 06/14/2013 Elsevier Interactive Patient Education 2016 Elsevier Inc.  

## 2015-06-22 LAB — GLUCOSE TOLERANCE, 1 HOUR (50G) W/O FASTING: Glucose, 1 Hr, gestational: 72 mg/dL (ref <140)

## 2015-06-22 LAB — HIV ANTIBODY (ROUTINE TESTING W REFLEX): HIV 1&2 Ab, 4th Generation: NONREACTIVE

## 2015-06-24 ENCOUNTER — Telehealth: Payer: Self-pay | Admitting: *Deleted

## 2015-06-24 NOTE — Telephone Encounter (Signed)
Pt notified of normal 1 hr GTT results.

## 2015-07-04 ENCOUNTER — Ambulatory Visit (INDEPENDENT_AMBULATORY_CARE_PROVIDER_SITE_OTHER): Payer: BLUE CROSS/BLUE SHIELD | Admitting: Obstetrics & Gynecology

## 2015-07-04 ENCOUNTER — Encounter: Payer: Self-pay | Admitting: Obstetrics & Gynecology

## 2015-07-04 VITALS — BP 125/80 | HR 92 | Wt 155.0 lb

## 2015-07-04 DIAGNOSIS — Z3482 Encounter for supervision of other normal pregnancy, second trimester: Secondary | ICD-10-CM

## 2015-07-04 DIAGNOSIS — O34219 Maternal care for unspecified type scar from previous cesarean delivery: Secondary | ICD-10-CM

## 2015-07-04 DIAGNOSIS — Z8759 Personal history of other complications of pregnancy, childbirth and the puerperium: Secondary | ICD-10-CM

## 2015-07-04 NOTE — Progress Notes (Signed)
Subjective:  Victoria Lin is a 30 y.o. G2P0101 at 6737w3d being seen today for ongoing prenatal care.  She is currently monitored for the following issues for this low-risk pregnancy and has Chronic headaches; Previous preterm delivery in first trimester, antepartum; History of gestational hypertension; Previous cesarean delivery affecting pregnancy, antepartum; and Supervision of normal subsequent pregnancy on her problem list.  Patient reports no complaints.  Contractions: Not present. Vag. Bleeding: None.  Movement: Present. Denies leaking of fluid.   The following portions of the patient's history were reviewed and updated as appropriate: allergies, current medications, past family history, past medical history, past social history, past surgical history and problem list. Problem list updated.  Objective:   Filed Vitals:   07/04/15 1435  BP: 125/80  Pulse: 92  Weight: 155 lb (70.308 kg)    Fetal Status: Fetal Heart Rate (bpm): 158 Fundal Height: 29 cm Movement: Present     General:  Alert, oriented and cooperative. Patient is in no acute distress.  Skin: Skin is warm and dry. No rash noted.   Cardiovascular: Normal heart rate noted  Respiratory: Normal respiratory effort, no problems with respiration noted  Abdomen: Soft, gravid, appropriate for gestational age. Pain/Pressure: Absent     Pelvic: Vag. Bleeding: None Vag D/C Character: Thin   Cervical exam deferred        Extremities: Normal range of motion.  Edema: None  Mental Status: Normal mood and affect. Normal behavior. Normal judgment and thought content.   Urinalysis: Urine Protein: Negative Urine Glucose: Negative  Assessment and Plan:  Pregnancy: G2P0101 at 1937w3d  1. Encounter for supervision of other normal pregnancy in second trimester   2. Previous cesarean delivery affecting pregnancy, antepartum  - She is planning a TOLAC  3. History of gestational hypertension   Preterm labor symptoms and general  obstetric precautions including but not limited to vaginal bleeding, contractions, leaking of fluid and fetal movement were reviewed in detail with the patient. Please refer to After Visit Summary for other counseling recommendations.  Return in about 3 weeks (around 07/25/2015).   Allie BossierMyra C Virgal Warmuth, MD

## 2015-07-26 ENCOUNTER — Ambulatory Visit (INDEPENDENT_AMBULATORY_CARE_PROVIDER_SITE_OTHER): Payer: BLUE CROSS/BLUE SHIELD | Admitting: Family

## 2015-07-26 VITALS — BP 121/79 | HR 83 | Wt 158.0 lb

## 2015-07-26 DIAGNOSIS — Z3483 Encounter for supervision of other normal pregnancy, third trimester: Secondary | ICD-10-CM

## 2015-07-26 DIAGNOSIS — O34219 Maternal care for unspecified type scar from previous cesarean delivery: Secondary | ICD-10-CM

## 2015-07-26 DIAGNOSIS — O09211 Supervision of pregnancy with history of pre-term labor, first trimester: Secondary | ICD-10-CM

## 2015-07-26 NOTE — Progress Notes (Signed)
Subjective:  Victoria Lin is a 30 y.o. G2P0101 at 6926w4d being seen today for ongoing prenatal care.  She is currently monitored for the following issues for this high-risk pregnancy and has Chronic headaches; Previous preterm delivery in first trimester, antepartum; History of gestational hypertension; Previous cesarean delivery affecting pregnancy, antepartum; and Supervision of normal subsequent pregnancy on her problem list.  Patient reports filling almost out; desires dental letter.  Contractions: Not present. Vag. Bleeding: None.  Movement: Present. Denies leaking of fluid.   The following portions of the patient's history were reviewed and updated as appropriate: allergies, current medications, past family history, past medical history, past social history, past surgical history and problem list. Problem list updated.  Objective:   Filed Vitals:   07/26/15 0958  BP: 121/79  Pulse: 83  Weight: 158 lb (71.668 kg)    Fetal Status: Fetal Heart Rate (bpm): 167 Fundal Height: 34 cm Movement: Present     General:  Alert, oriented and cooperative. Patient is in no acute distress.  Skin: Skin is warm and dry. No rash noted.   Cardiovascular: Normal heart rate noted  Respiratory: Normal respiratory effort, no problems with respiration noted  Abdomen: Soft, gravid, appropriate for gestational age. Pain/Pressure: Present     Pelvic: Vag. Bleeding: None Vag D/C Character: Thin   Cervical exam deferred        Extremities: Normal range of motion.  Edema: None  Mental Status: Normal mood and affect. Normal behavior. Normal judgment and thought content.   Urinalysis: Urine Protein: Negative Urine Glucose: Negative  Assessment and Plan:  Pregnancy: G2P0101 at 226w4d  1. Encounter for supervision of other normal pregnancy in third trimester - Reviewed 1hr results - Given dental letter 2. Previous preterm delivery in first trimester, antepartum - Declined 17p  3. Previous cesarean delivery  affecting pregnancy, antepartum - TOLAC planned  Preterm labor symptoms and general obstetric precautions including but not limited to vaginal bleeding, contractions, leaking of fluid and fetal movement were reviewed in detail with the patient. Please refer to After Visit Summary for other counseling recommendations.  No Follow-up on file.   Eino FarberWalidah Kennith GainN Karim, CNM

## 2015-08-09 ENCOUNTER — Ambulatory Visit (INDEPENDENT_AMBULATORY_CARE_PROVIDER_SITE_OTHER): Payer: BLUE CROSS/BLUE SHIELD | Admitting: Certified Nurse Midwife

## 2015-08-09 VITALS — BP 126/81 | HR 88 | Wt 163.0 lb

## 2015-08-09 DIAGNOSIS — Z8759 Personal history of other complications of pregnancy, childbirth and the puerperium: Secondary | ICD-10-CM

## 2015-08-09 DIAGNOSIS — O09211 Supervision of pregnancy with history of pre-term labor, first trimester: Secondary | ICD-10-CM

## 2015-08-09 DIAGNOSIS — Z3483 Encounter for supervision of other normal pregnancy, third trimester: Secondary | ICD-10-CM

## 2015-08-09 DIAGNOSIS — O34219 Maternal care for unspecified type scar from previous cesarean delivery: Secondary | ICD-10-CM

## 2015-08-09 NOTE — Progress Notes (Signed)
Subjective:  Victoria Lin is a 30 y.o. G2P0101 at 4733w4d being seen today for ongoing prenatal care.  She is currently monitored for the following issues for this high-risk pregnancy and has Chronic headaches; Previous preterm delivery in first trimester, antepartum; History of gestational hypertension; Previous cesarean delivery affecting pregnancy, antepartum; and Supervision of normal subsequent pregnancy on her problem list.  Patient reports no complaints.  Contractions: Irritability. Vag. Bleeding: None.  Movement: Present. Denies leaking of fluid.   The following portions of the patient's history were reviewed and updated as appropriate: allergies, current medications, past family history, past medical history, past social history, past surgical history and problem list. Problem list updated.  Objective:   Filed Vitals:   08/09/15 1018  BP: 126/81  Pulse: 88  Weight: 163 lb (73.936 kg)    Fetal Status: Fetal Heart Rate (bpm): 147   Movement: Present     General:  Alert, oriented and cooperative. Patient is in no acute distress.  Skin: Skin is warm and dry. No rash noted.   Cardiovascular: Normal heart rate noted  Respiratory: Normal respiratory effort, no problems with respiration noted  Abdomen: Soft, gravid, appropriate for gestational age. Pain/Pressure: Present     Pelvic: Vag. Bleeding: None Vag D/C Character: Thin   Cervical exam deferred        Extremities: Normal range of motion.  Edema: None  Mental Status: Normal mood and affect. Normal behavior. Normal judgment and thought content.   Urinalysis: Urine Protein: Negative Urine Glucose: Negative  Assessment and Plan:  Pregnancy: G2P0101 at 5933w4d  1. Previous cesarean delivery affecting pregnancy, antepartum   2. Previous preterm delivery in first trimester, antepartum   3. Encounter for supervision of other normal pregnancy in third trimester   4. History of gestational hypertension   Preterm labor  symptoms and general obstetric precautions including but not limited to vaginal bleeding, contractions, leaking of fluid and fetal movement were reviewed in detail with the patient. Please refer to After Visit Summary for other counseling recommendations.  Return in about 2 weeks (around 08/23/2015).   Rhea PinkLori A Venetia Prewitt, CNM

## 2015-08-09 NOTE — Patient Instructions (Signed)
Group B streptococcus (GBS) is a type of bacteria often found in healthy women. GBS is not the same as the bacteria that causes strep throat. You may have GBS in your vagina, rectum, or bladder. GBS does not spread through sexual contact, but it can be passed to a baby during childbirth. This can be dangerous for your baby. It is not dangerous to you and usually does not cause any symptoms. Your health care provider may test you for GBS when your pregnancy is between 35 and 37 weeks. GBS is dangerous only during birth, so there is no need to test for it earlier. It is possible to have GBS during pregnancy and never pass it to your baby. If your test results are positive for GBS, your health care provider may recommend giving you antibiotic medicine during delivery to make sure your baby stays healthy. RISK FACTORS You are more likely to pass GBS to your baby if:   Your water breaks (ruptured membrane) or you go into labor before 37 weeks.  Your water breaks 18 hours before you deliver.  You passed GBS during a previous pregnancy.  You have a urinary tract infection caused by GBS any time during pregnancy.  You have a fever during labor. SYMPTOMS Most women who have GBS do not have any symptoms. If you have a urinary tract infection caused by GBS, you might have frequent or painful urination and fever. Babies who get GBS usually show symptoms within 7 days of birth. Symptoms may include:   Breathing problems.  Heart and blood pressure problems.  Digestive and kidney problems. DIAGNOSIS Routine screening for GBS is recommended for all pregnant women. A health care provider takes a sample of the fluid in your vagina and rectum with a swab. It is then sent to a lab to be checked for GBS. A sample of your urine may also be checked for the bacteria.  TREATMENT If you test positive for GBS, you may need treatment with an antibiotic medicine during labor. As soon as you go into labor, or as soon as  your membranes rupture, you will get the antibiotic medicine through an IV access. You will continue to get the medicine until after you give birth. You do not need antibiotic medicine if you are having a cesarean delivery.If your baby shows signs or symptoms of GBS after birth, your baby can also be treated with an antibiotic medicine. HOME CARE INSTRUCTIONS   Take all antibiotic medicine as prescribed by your health care provider. Only take medicine as directed.   Continue with prenatal visits and care.   Keep all follow-up appointments.  SEEK MEDICAL CARE IF:   You have pain when you urinate.   You have to urinate frequently.   You have a fever.  SEEK IMMEDIATE MEDICAL CARE IF:   Your membranes rupture.  You go into labor.   This information is not intended to replace advice given to you by your health care provider. Make sure you discuss any questions you have with your health care provider.   Document Released: 06/09/2007 Document Revised: 03/07/2013 Document Reviewed: 12/23/2012 Elsevier Interactive Patient Education 2016 Elsevier Inc.  

## 2015-08-23 ENCOUNTER — Encounter: Payer: BLUE CROSS/BLUE SHIELD | Admitting: Certified Nurse Midwife

## 2015-08-26 ENCOUNTER — Ambulatory Visit (INDEPENDENT_AMBULATORY_CARE_PROVIDER_SITE_OTHER): Payer: BLUE CROSS/BLUE SHIELD | Admitting: Advanced Practice Midwife

## 2015-08-26 VITALS — BP 136/87 | HR 74 | Wt 169.0 lb

## 2015-08-26 DIAGNOSIS — Z113 Encounter for screening for infections with a predominantly sexual mode of transmission: Secondary | ICD-10-CM

## 2015-08-26 DIAGNOSIS — Z3483 Encounter for supervision of other normal pregnancy, third trimester: Secondary | ICD-10-CM

## 2015-08-26 DIAGNOSIS — Z3493 Encounter for supervision of normal pregnancy, unspecified, third trimester: Secondary | ICD-10-CM

## 2015-08-26 LAB — OB RESULTS CONSOLE GBS: GBS: NEGATIVE

## 2015-08-26 NOTE — Progress Notes (Signed)
Subjective:  Victoria Lin is a 30 y.o. G2P0101 at 374w0d being seen today for ongoing prenatal care.  She is currently monitored for the following issues for this low-risk pregnancy and has Chronic headaches; Previous preterm delivery in first trimester, antepartum; History of gestational hypertension; Previous cesarean delivery affecting pregnancy, antepartum; and Supervision of normal subsequent pregnancy on her problem list.  Patient reports BH ctx.  Contractions: Irritability. Vag. Bleeding: None.  Movement: Present. Denies leaking of fluid. No HA, visual disturbances, or epigastric pain.  The following portions of the patient's history were reviewed and updated as appropriate: allergies, current medications, past family history, past medical history, past social history, past surgical history and problem list. Problem list updated.  Objective:   Filed Vitals:   08/26/15 0931  BP: 136/87  Pulse: 74  Weight: 169 lb (76.658 kg)    Fetal Status: Fetal Heart Rate (bpm): 150   Movement: Present  Presentation: Vertex  General:  Alert, oriented and cooperative. Patient is in no acute distress.  Skin: Skin is warm and dry. No rash noted.   Cardiovascular: Normal heart rate noted  Respiratory: Normal respiratory effort, no problems with respiration noted  Abdomen: Soft, gravid, appropriate for gestational age. Pain/Pressure: Present     Pelvic: Vag. Bleeding: None Vag D/C Character: Thin   Cervical exam performed Dilation: 1 Effacement (%): 50 Station: -3  Extremities: Normal range of motion.  Edema: Mild pitting, slight indentation  Mental Status: Normal mood and affect. Normal behavior. Normal judgment and thought content.   Urinalysis: Urine Protein: Negative Urine Glucose: Trace  Assessment and Plan:  Pregnancy: G2P0101 at 724w0d  1. Normal pregnancy, third trimester -BP borderline today, reviewed pre-e precautions - Culture, beta strep (group b only) - Urine cytology ancillary  only  Term labor symptoms and general obstetric precautions including but not limited to vaginal bleeding, contractions, leaking of fluid and fetal movement were reviewed in detail with the patient. Please refer to After Visit Summary for other counseling recommendations.  Return in about 1 week (around 09/02/2015).   Donette LarryMelanie Tamieka Rancourt, CNM

## 2015-08-26 NOTE — Patient Instructions (Signed)
Braxton Hicks Contractions °Contractions of the uterus can occur throughout pregnancy. Contractions are not always a sign that you are in labor.  °WHAT ARE BRAXTON HICKS CONTRACTIONS?  °Contractions that occur before labor are called Braxton Hicks contractions, or false labor. Toward the end of pregnancy (32-34 weeks), these contractions can develop more often and may become more forceful. This is not true labor because these contractions do not result in opening (dilatation) and thinning of the cervix. They are sometimes difficult to tell apart from true labor because these contractions can be forceful and people have different pain tolerances. You should not feel embarrassed if you go to the hospital with false labor. Sometimes, the only way to tell if you are in true labor is for your health care provider to look for changes in the cervix. °If there are no prenatal problems or other health problems associated with the pregnancy, it is completely safe to be sent home with false labor and await the onset of true labor. °HOW CAN YOU TELL THE DIFFERENCE BETWEEN TRUE AND FALSE LABOR? °False Labor °· The contractions of false labor are usually shorter and not as hard as those of true labor.   °· The contractions are usually irregular.   °· The contractions are often felt in the front of the lower abdomen and in the groin.   °· The contractions may go away when you walk around or change positions while lying down.   °· The contractions get weaker and are shorter lasting as time goes on.   °· The contractions do not usually become progressively stronger, regular, and closer together as with true labor.   °True Labor °· Contractions in true labor last 30-70 seconds, become very regular, usually become more intense, and increase in frequency.   °· The contractions do not go away with walking.   °· The discomfort is usually felt in the top of the uterus and spreads to the lower abdomen and low back.   °· True labor can be  determined by your health care provider with an exam. This will show that the cervix is dilating and getting thinner.   °WHAT TO REMEMBER °· Keep up with your usual exercises and follow other instructions given by your health care provider.   °· Take medicines as directed by your health care provider.   °· Keep your regular prenatal appointments.   °· Eat and drink lightly if you think you are going into labor.   °· If Braxton Hicks contractions are making you uncomfortable:   °¨ Change your position from lying down or resting to walking, or from walking to resting.   °¨ Sit and rest in a tub of warm water.   °¨ Drink 2-3 glasses of water. Dehydration may cause these contractions.   °¨ Do slow and deep breathing several times an hour.   °WHEN SHOULD I SEEK IMMEDIATE MEDICAL CARE? °Seek immediate medical care if: °· Your contractions become stronger, more regular, and closer together.   °· You have fluid leaking or gushing from your vagina.   °· You have a fever.   °· You pass blood-tinged mucus.   °· You have vaginal bleeding.   °· You have continuous abdominal pain.   °· You have low back pain that you never had before.   °· You feel your baby's head pushing down and causing pelvic pressure.   °· Your baby is not moving as much as it used to.   °  °This information is not intended to replace advice given to you by your health care provider. Make sure you discuss any questions you have with your health care   provider. °  °Document Released: 03/02/2005 Document Revised: 03/07/2013 Document Reviewed: 12/12/2012 °Elsevier Interactive Patient Education ©2016 [employer] Inc. ° °

## 2015-08-27 LAB — URINE CYTOLOGY ANCILLARY ONLY
Chlamydia: NEGATIVE
NEISSERIA GONORRHEA: NEGATIVE

## 2015-08-28 LAB — CULTURE, BETA STREP (GROUP B ONLY)

## 2015-09-02 ENCOUNTER — Ambulatory Visit (INDEPENDENT_AMBULATORY_CARE_PROVIDER_SITE_OTHER): Payer: BLUE CROSS/BLUE SHIELD | Admitting: Obstetrics & Gynecology

## 2015-09-02 VITALS — BP 144/86 | HR 86 | Wt 170.0 lb

## 2015-09-02 DIAGNOSIS — Z3483 Encounter for supervision of other normal pregnancy, third trimester: Secondary | ICD-10-CM

## 2015-09-02 DIAGNOSIS — O321XX1 Maternal care for breech presentation, fetus 1: Secondary | ICD-10-CM | POA: Diagnosis not present

## 2015-09-02 NOTE — Progress Notes (Signed)
Subjective:  Victoria Lin is a 30 y.o. G2P0101 at 1470w0d being seen today for ongoing prenatal care.  She is currently monitored for the following issues for this high-risk pregnancy and has Chronic headaches; Previous preterm delivery in first trimester, antepartum; History of gestational hypertension; Previous cesarean delivery affecting pregnancy, antepartum; and Supervision of normal subsequent pregnancy on her problem list.  Patient reports no complaints.  Contractions: Irritability. Vag. Bleeding: None.  Movement: Present. Denies leaking of fluid.   The following portions of the patient's history were reviewed and updated as appropriate: allergies, current medications, past family history, past medical history, past social history, past surgical history and problem list. Problem list updated.  Objective:   Filed Vitals:   09/02/15 1522  BP: 144/86  Pulse: 86  Weight: 170 lb (77.111 kg)    Fetal Status: Fetal Heart Rate (bpm): 147   Movement: Present     General:  Alert, oriented and cooperative. Patient is in no acute distress.  Skin: Skin is warm and dry. No rash noted.   Cardiovascular: Normal heart rate noted  Respiratory: Normal respiratory effort, no problems with respiration noted  Abdomen: Soft, gravid, appropriate for gestational age. Pain/Pressure: Present     Pelvic: Cervical exam performed        Extremities: Normal range of motion.  Edema: Trace  Mental Status: Normal mood and affect. Normal behavior. Normal judgment and thought content.   Urinalysis: Urine Protein: Negative Urine Glucose: Negative  Assessment and Plan:  Pregnancy: G2P0101 at 8170w0d  1. Encounter for supervision of other normal pregnancy in third trimester Breech on US today.  Discussed rpt c/s vs version.  Pt prefers rpt c/s.  Will schedule at 39 weeks.  Term labor symptoms and general obstetric precautions including but not limited to vaginal bleeding, contractions, leaking of fluid and fetal  movement were reviewed in detail with the patient. Please refer to After Visit Summary for other counseling recommendations.     Lesly DukesKelly H Leggett, MD

## 2015-09-04 ENCOUNTER — Encounter (HOSPITAL_COMMUNITY): Payer: Self-pay

## 2015-09-04 NOTE — Patient Instructions (Addendum)
20 Holli HumblesJessica L Verrette  09/04/2015   Your procedure is scheduled on:  09/09/15  [facility] 0130 [facility].  Pick up the phone at the desk and dial [phone].   Call this number if you have problems the morning of surgery: [phone]   Remember:   Do not eat food:6 Hours before arrival.  Do not drink clear liquids: 6 Hours before arrival.  Take these medicines the morning of surgery with A SIP OF WATER: NONE   Do not wear jewelry, make-up or nail polish.  Do not wear lotions, powders, or perfumes. You may wear deodorant.  Do not shave 48 hours prior to surgery.  Do not bring valuables to the hospital.  [facility] is not   responsible for any belongings or valuables brought to the hospital.  Contacts, dentures or bridgework may not be worn into surgery.  Leave suitcase in the car. After surgery it may be brought to your room.  For patients admitted to the hospital, checkout time is 11:00 AM the day of              discharge.   Patients discharged the day of surgery will not be allowed to drive             home.  Name and phone number of your driver: na  Special Instructions:   Shower using CHG 2 nights before surgery and the night before surgery.  If you shower the day of surgery use CHG.  Use special wash - you have one bottle of CHG for all showers.  You should use approximately 1/3 of the bottle for each shower.   Please read over the following fact sheets that you were given:   Surgical Site Infection Prevention

## 2015-09-06 ENCOUNTER — Encounter (HOSPITAL_COMMUNITY)
Admission: RE | Admit: 2015-09-06 | Discharge: 2015-09-06 | Disposition: A | Discharge status: Home / Self Care | Payer: BLUE CROSS/BLUE SHIELD | Source: Ambulatory Visit | Attending: Obstetrics & Gynecology | Admitting: Obstetrics & Gynecology

## 2015-09-06 LAB — CBC
HEMATOCRIT: 34.4 % — AB (ref 36.0–46.0)
HEMOGLOBIN: 11.8 g/dL — AB (ref 12.0–15.0)
MCH: 30.7 pg (ref 26.0–34.0)
MCHC: 34.3 g/dL (ref 30.0–36.0)
MCV: 89.6 fL (ref 78.0–100.0)
PLATELETS: 171 10*3/uL (ref 150–400)
RBC: 3.84 MIL/uL — AB (ref 3.87–5.11)
RDW: 13.2 % (ref 11.5–15.5)
WBC: 8.4 10*3/uL (ref 4.0–10.5)

## 2015-09-06 LAB — TYPE AND SCREEN
ABO/RH(D): O POS
Antibody Screen: NEGATIVE

## 2015-09-07 LAB — RPR: RPR Ser Ql: NONREACTIVE

## 2015-09-09 ENCOUNTER — Encounter (HOSPITAL_COMMUNITY): Payer: Self-pay | Admitting: Emergency Medicine

## 2015-09-09 ENCOUNTER — Encounter (HOSPITAL_COMMUNITY)
Admission: RE | Disposition: A | Discharge status: Home / Self Care | Payer: Self-pay | Source: Ambulatory Visit | Attending: Obstetrics and Gynecology

## 2015-09-09 ENCOUNTER — Inpatient Hospital Stay (HOSPITAL_COMMUNITY)
Admission: RE | Admit: 2015-09-09 | Discharge: 2015-09-11 | DRG: 766 | Disposition: A | Discharge status: Home / Self Care | Payer: BLUE CROSS/BLUE SHIELD | Source: Ambulatory Visit | Attending: Obstetrics and Gynecology | Admitting: Obstetrics and Gynecology

## 2015-09-09 ENCOUNTER — Inpatient Hospital Stay (HOSPITAL_COMMUNITY): Payer: BLUE CROSS/BLUE SHIELD | Admitting: Anesthesiology

## 2015-09-09 DIAGNOSIS — Z98891 History of uterine scar from previous surgery: Secondary | ICD-10-CM

## 2015-09-09 DIAGNOSIS — R51 Headache: Secondary | ICD-10-CM | POA: Diagnosis present

## 2015-09-09 DIAGNOSIS — O34211 Maternal care for low transverse scar from previous cesarean delivery: Secondary | ICD-10-CM | POA: Diagnosis present

## 2015-09-09 DIAGNOSIS — O09219 Supervision of pregnancy with history of pre-term labor, unspecified trimester: Secondary | ICD-10-CM

## 2015-09-09 DIAGNOSIS — R519 Headache, unspecified: Secondary | ICD-10-CM | POA: Diagnosis present

## 2015-09-09 DIAGNOSIS — Z9889 Other specified postprocedural states: Secondary | ICD-10-CM | POA: Diagnosis not present

## 2015-09-09 DIAGNOSIS — Z8759 Personal history of other complications of pregnancy, childbirth and the puerperium: Secondary | ICD-10-CM

## 2015-09-09 DIAGNOSIS — O321XX0 Maternal care for breech presentation, not applicable or unspecified: Principal | ICD-10-CM | POA: Diagnosis present

## 2015-09-09 DIAGNOSIS — Z3A39 39 weeks gestation of pregnancy: Secondary | ICD-10-CM

## 2015-09-09 DIAGNOSIS — Z8261 Family history of arthritis: Secondary | ICD-10-CM | POA: Diagnosis not present

## 2015-09-09 DIAGNOSIS — N839 Noninflammatory disorder of ovary, fallopian tube and broad ligament, unspecified: Secondary | ICD-10-CM | POA: Diagnosis present

## 2015-09-09 DIAGNOSIS — Z8249 Family history of ischemic heart disease and other diseases of the circulatory system: Secondary | ICD-10-CM

## 2015-09-09 DIAGNOSIS — O34219 Maternal care for unspecified type scar from previous cesarean delivery: Secondary | ICD-10-CM

## 2015-09-09 DIAGNOSIS — Z809 Family history of malignant neoplasm, unspecified: Secondary | ICD-10-CM

## 2015-09-09 DIAGNOSIS — Z3480 Encounter for supervision of other normal pregnancy, unspecified trimester: Secondary | ICD-10-CM

## 2015-09-09 DIAGNOSIS — O328XX0 Maternal care for other malpresentation of fetus, not applicable or unspecified: Secondary | ICD-10-CM

## 2015-09-09 DIAGNOSIS — Z79899 Other long term (current) drug therapy: Secondary | ICD-10-CM

## 2015-09-09 DIAGNOSIS — O09211 Supervision of pregnancy with history of pre-term labor, first trimester: Secondary | ICD-10-CM

## 2015-09-09 SURGERY — Surgical Case
Anesthesia: Spinal

## 2015-09-09 MED ORDER — ONDANSETRON HCL 4 MG/2ML IJ SOLN
INTRAMUSCULAR | Status: DC | PRN
  Administered 2015-09-09: 4 mg via INTRAVENOUS

## 2015-09-09 MED ORDER — OXYCODONE-ACETAMINOPHEN 5-325 MG PO TABS: 2.0000 | ORAL_TABLET | ORAL | Status: DC | PRN

## 2015-09-09 MED ORDER — DIPHENHYDRAMINE HCL 50 MG/ML IJ SOLN: 12.5000 mg | INTRAMUSCULAR | Status: DC | PRN

## 2015-09-09 MED ORDER — IBUPROFEN 600 MG PO TABS
600.0000 mg | ORAL_TABLET | Freq: Four times a day (QID) | ORAL | Status: DC
  Administered 2015-09-09 – 2015-09-11 (×8): 600 mg via ORAL
  Filled 2015-09-09 (×8): qty 1

## 2015-09-09 MED ORDER — SCOPOLAMINE 1 MG/3DAYS TD PT72
MEDICATED_PATCH | TRANSDERMAL | Status: DC
Start: 2015-09-09 — End: 2015-09-11
  Administered 2015-09-09: 1.5 mg via TRANSDERMAL
  Filled 2015-09-09: qty 1

## 2015-09-09 MED ORDER — BUPIVACAINE IN DEXTROSE 0.75-8.25 % IT SOLN
INTRATHECAL | Status: DC | PRN
  Administered 2015-09-09: 1.6 mL via INTRATHECAL

## 2015-09-09 MED ORDER — OXYCODONE-ACETAMINOPHEN 5-325 MG PO TABS
1.0000 | ORAL_TABLET | ORAL | Status: DC | PRN
  Administered 2015-09-11: 1 via ORAL
  Filled 2015-09-09 (×2): qty 1

## 2015-09-09 MED ORDER — NALBUPHINE HCL 10 MG/ML IJ SOLN: 5.0000 mg | INTRAMUSCULAR | Status: DC | PRN

## 2015-09-09 MED ORDER — MORPHINE SULFATE (PF) 0.5 MG/ML IJ SOLN
INTRAMUSCULAR | Status: AC
  Filled 2015-09-09: qty 10

## 2015-09-09 MED ORDER — WITCH HAZEL-GLYCERIN EX PADS: 1.0000 "application " | MEDICATED_PAD | CUTANEOUS | Status: DC | PRN

## 2015-09-09 MED ORDER — KETOROLAC TROMETHAMINE 30 MG/ML IJ SOLN
30.0000 mg | Freq: Four times a day (QID) | INTRAMUSCULAR | Status: DC | PRN
  Administered 2015-09-09: 30 mg via INTRAMUSCULAR

## 2015-09-09 MED ORDER — ONDANSETRON HCL 4 MG/2ML IJ SOLN
INTRAMUSCULAR | Status: AC
  Filled 2015-09-09: qty 2

## 2015-09-09 MED ORDER — KETOROLAC TROMETHAMINE 30 MG/ML IJ SOLN
INTRAMUSCULAR | Status: AC
  Administered 2015-09-09: 30 mg via INTRAMUSCULAR
  Filled 2015-09-09: qty 1

## 2015-09-09 MED ORDER — OXYTOCIN 40 UNITS IN LACTATED RINGERS INFUSION - SIMPLE MED: 2.5000 [IU]/h | INTRAVENOUS | Status: AC

## 2015-09-09 MED ORDER — COCONUT OIL OIL: 1.0000 "application " | TOPICAL_OIL | Status: DC | PRN

## 2015-09-09 MED ORDER — FENTANYL CITRATE (PF) 100 MCG/2ML IJ SOLN
INTRAMUSCULAR | Status: AC
  Filled 2015-09-09: qty 2

## 2015-09-09 MED ORDER — METHYLERGONOVINE MALEATE 0.2 MG/ML IJ SOLN
INTRAMUSCULAR | Status: DC | PRN
  Administered 2015-09-09: 0.2 mg via INTRAMUSCULAR

## 2015-09-09 MED ORDER — OXYTOCIN 10 UNIT/ML IJ SOLN
40.0000 [IU] | INTRAVENOUS | Status: DC | PRN
  Administered 2015-09-09: 40 [IU] via INTRAVENOUS

## 2015-09-09 MED ORDER — NALBUPHINE HCL 10 MG/ML IJ SOLN: 5.0000 mg | Freq: Once | INTRAMUSCULAR | Status: DC | PRN

## 2015-09-09 MED ORDER — ZOLPIDEM TARTRATE 5 MG PO TABS: 5.0000 mg | ORAL_TABLET | Freq: Every evening | ORAL | Status: DC | PRN

## 2015-09-09 MED ORDER — SIMETHICONE 80 MG PO CHEW
80.0000 mg | CHEWABLE_TABLET | ORAL | Status: DC
  Administered 2015-09-09 – 2015-09-10 (×2): 80 mg via ORAL
  Filled 2015-09-09 (×2): qty 1

## 2015-09-09 MED ORDER — MENTHOL 3 MG MT LOZG: 1.0000 | LOZENGE | OROMUCOSAL | Status: DC | PRN

## 2015-09-09 MED ORDER — SENNOSIDES-DOCUSATE SODIUM 8.6-50 MG PO TABS
2.0000 | ORAL_TABLET | ORAL | Status: DC
  Administered 2015-09-09 – 2015-09-10 (×2): 2 via ORAL
  Filled 2015-09-09 (×2): qty 2

## 2015-09-09 MED ORDER — FENTANYL CITRATE (PF) 100 MCG/2ML IJ SOLN: 25.0000 ug | INTRAMUSCULAR | Status: DC | PRN

## 2015-09-09 MED ORDER — PRENATAL MULTIVITAMIN CH
1.0000 | ORAL_TABLET | Freq: Every day | ORAL | Status: DC
  Administered 2015-09-10 – 2015-09-11 (×2): 1 via ORAL
  Filled 2015-09-09 (×2): qty 1

## 2015-09-09 MED ORDER — DIPHENHYDRAMINE HCL 25 MG PO CAPS: 25.0000 mg | ORAL_CAPSULE | Freq: Four times a day (QID) | ORAL | Status: DC | PRN

## 2015-09-09 MED ORDER — ONDANSETRON HCL 4 MG/2ML IJ SOLN: 4.0000 mg | Freq: Three times a day (TID) | INTRAMUSCULAR | Status: DC | PRN

## 2015-09-09 MED ORDER — SIMETHICONE 80 MG PO CHEW: 80.0000 mg | CHEWABLE_TABLET | ORAL | Status: DC | PRN

## 2015-09-09 MED ORDER — KETOROLAC TROMETHAMINE 30 MG/ML IJ SOLN: 30.0000 mg | Freq: Four times a day (QID) | INTRAMUSCULAR | Status: DC | PRN

## 2015-09-09 MED ORDER — FENTANYL CITRATE (PF) 100 MCG/2ML IJ SOLN
INTRAMUSCULAR | Status: DC | PRN
  Administered 2015-09-09: 20 ug via INTRATHECAL

## 2015-09-09 MED ORDER — SCOPOLAMINE 1 MG/3DAYS TD PT72
1.0000 | MEDICATED_PATCH | Freq: Once | TRANSDERMAL | Status: DC
  Administered 2015-09-09: 1.5 mg via TRANSDERMAL

## 2015-09-09 MED ORDER — SODIUM CHLORIDE 0.9% FLUSH: 3.0000 mL | INTRAVENOUS | Status: DC | PRN

## 2015-09-09 MED ORDER — NALOXONE HCL 2 MG/2ML IJ SOSY: 1.0000 ug/kg/h | PREFILLED_SYRINGE | INTRAMUSCULAR | Status: DC | PRN

## 2015-09-09 MED ORDER — PHENYLEPHRINE 8 MG IN D5W 100 ML (0.08MG/ML) PREMIX OPTIME
INJECTION | INTRAVENOUS | Status: DC | PRN
  Administered 2015-09-09: 60 ug/min via INTRAVENOUS

## 2015-09-09 MED ORDER — TETANUS-DIPHTH-ACELL PERTUSSIS 5-2.5-18.5 LF-MCG/0.5 IM SUSP: 0.5000 mL | Freq: Once | INTRAMUSCULAR | Status: DC

## 2015-09-09 MED ORDER — OXYTOCIN 10 UNIT/ML IJ SOLN
INTRAMUSCULAR | Status: AC
  Filled 2015-09-09: qty 4

## 2015-09-09 MED ORDER — MEPERIDINE HCL 25 MG/ML IJ SOLN: 6.2500 mg | INTRAMUSCULAR | Status: DC | PRN

## 2015-09-09 MED ORDER — PHENYLEPHRINE 8 MG IN D5W 100 ML (0.08MG/ML) PREMIX OPTIME
INJECTION | INTRAVENOUS | Status: AC
  Filled 2015-09-09: qty 100

## 2015-09-09 MED ORDER — ACETAMINOPHEN 325 MG PO TABS
650.0000 mg | ORAL_TABLET | ORAL | Status: DC | PRN
  Administered 2015-09-10: 650 mg via ORAL
  Filled 2015-09-09: qty 2

## 2015-09-09 MED ORDER — MISOPROSTOL 200 MCG PO TABS
ORAL_TABLET | ORAL | Status: AC
  Filled 2015-09-09: qty 2

## 2015-09-09 MED ORDER — SCOPOLAMINE 1 MG/3DAYS TD PT72: 1.0000 | MEDICATED_PATCH | Freq: Once | TRANSDERMAL | Status: DC

## 2015-09-09 MED ORDER — DIBUCAINE 1 % RE OINT: 1.0000 "application " | TOPICAL_OINTMENT | RECTAL | Status: DC | PRN

## 2015-09-09 MED ORDER — NALOXONE HCL 0.4 MG/ML IJ SOLN: 0.4000 mg | INTRAMUSCULAR | Status: DC | PRN

## 2015-09-09 MED ORDER — LACTATED RINGERS IV SOLN
INTRAVENOUS | Status: DC
  Administered 2015-09-10: 04:00:00 via INTRAVENOUS

## 2015-09-09 MED ORDER — DIPHENHYDRAMINE HCL 25 MG PO CAPS: 25.0000 mg | ORAL_CAPSULE | ORAL | Status: DC | PRN

## 2015-09-09 MED ORDER — SOD CITRATE-CITRIC ACID 500-334 MG/5ML PO SOLN
30.0000 mL | ORAL | Status: DC
  Filled 2015-09-09: qty 30

## 2015-09-09 MED ORDER — CEFAZOLIN SODIUM-DEXTROSE 2-4 GM/100ML-% IV SOLN
2.0000 g | INTRAVENOUS | Status: AC
  Administered 2015-09-09: 2 g via INTRAVENOUS
  Filled 2015-09-09: qty 100

## 2015-09-09 MED ORDER — LACTATED RINGERS IV SOLN
Freq: Once | INTRAVENOUS | Status: AC
  Administered 2015-09-09: 14:00:00 via INTRAVENOUS

## 2015-09-09 MED ORDER — SIMETHICONE 80 MG PO CHEW
80.0000 mg | CHEWABLE_TABLET | Freq: Three times a day (TID) | ORAL | Status: DC
  Administered 2015-09-10 – 2015-09-11 (×5): 80 mg via ORAL
  Filled 2015-09-09 (×5): qty 1

## 2015-09-09 MED ORDER — LACTATED RINGERS IV SOLN
INTRAVENOUS | Status: DC
Start: 2015-09-09 — End: 2015-09-09
  Administered 2015-09-09 (×3): via INTRAVENOUS

## 2015-09-09 MED ORDER — MORPHINE SULFATE (PF) 0.5 MG/ML IJ SOLN
INTRAMUSCULAR | Status: DC | PRN
  Administered 2015-09-09: .2 mg via INTRATHECAL

## 2015-09-09 SURGICAL SUPPLY — 33 items
BARRIER ADHS 3X4 INTERCEED (GAUZE/BANDAGES/DRESSINGS) ×3 IMPLANT
BENZOIN TINCTURE PRP APPL 2/3 (GAUZE/BANDAGES/DRESSINGS) ×3 IMPLANT
CLAMP CORD UMBIL (MISCELLANEOUS) IMPLANT
CLOTH BEACON ORANGE TIMEOUT ST (SAFETY) ×3 IMPLANT
DRAIN JACKSON PRT FLT 7MM (DRAIN) IMPLANT
DRSG OPSITE POSTOP 4X10 (GAUZE/BANDAGES/DRESSINGS) ×3 IMPLANT
DURAPREP 26ML APPLICATOR (WOUND CARE) ×3 IMPLANT
ELECT REM PT RETURN 9FT ADLT (ELECTROSURGICAL) ×3
ELECTRODE REM PT RTRN 9FT ADLT (ELECTROSURGICAL) ×1 IMPLANT
EVACUATOR SILICONE 100CC (DRAIN) IMPLANT
EXTRACTOR VACUUM M CUP 4 TUBE (SUCTIONS) IMPLANT
EXTRACTOR VACUUM M CUP 4' TUBE (SUCTIONS)
GLOVE BIO SURGEON STRL SZ7 (GLOVE) ×3 IMPLANT
GLOVE BIOGEL PI IND STRL 7.0 (GLOVE) ×2 IMPLANT
GLOVE BIOGEL PI INDICATOR 7.0 (GLOVE) ×4
GOWN STRL REUS W/TWL LRG LVL3 (GOWN DISPOSABLE) ×6 IMPLANT
KIT ABG SYR 3ML LUER SLIP (SYRINGE) IMPLANT
NEEDLE HYPO 25X5/8 SAFETYGLIDE (NEEDLE) ×3 IMPLANT
NS IRRIG 1000ML POUR BTL (IV SOLUTION) ×3 IMPLANT
PACK C SECTION WH (CUSTOM PROCEDURE TRAY) ×3 IMPLANT
PAD ABD 7.5X8 STRL (GAUZE/BANDAGES/DRESSINGS) ×3 IMPLANT
PAD OB MATERNITY 4.3X12.25 (PERSONAL CARE ITEMS) ×3 IMPLANT
PENCIL SMOKE EVAC W/HOLSTER (ELECTROSURGICAL) ×3 IMPLANT
RTRCTR C-SECT PINK 25CM LRG (MISCELLANEOUS) ×3 IMPLANT
SUT MON AB 2-0 SH 27 (SUTURE) ×2
SUT MON AB 2-0 SH27 (SUTURE) ×1 IMPLANT
SUT VIC AB 0 CTX 36 (SUTURE) ×10
SUT VIC AB 0 CTX36XBRD ANBCTRL (SUTURE) ×5 IMPLANT
SUT VIC AB 2-0 SH 27 (SUTURE) ×2
SUT VIC AB 2-0 SH 27XBRD (SUTURE) ×1 IMPLANT
SUT VIC AB 4-0 KS 27 (SUTURE) ×3 IMPLANT
TOWEL OR 17X24 6PK STRL BLUE (TOWEL DISPOSABLE) ×3 IMPLANT
TRAY FOLEY CATH SILVER 14FR (SET/KITS/TRAYS/PACK) ×3 IMPLANT

## 2015-09-09 NOTE — H&P (Signed)
Obstetric Preoperative History and Physical  Victoria Lin is a 30 y.o. G2P0101 with IUP at 5664w0d presenting for presenting for scheduled cesarean section due to breech  No acute concerns.   Prenatal Course Source of Care: [facility]  with onset of care at 7 weeks Pregnancy complications or risks: Patient Active Problem List   Diagnosis Date Noted  . Supervision of normal subsequent pregnancy 03/29/2015  . Previous cesarean delivery affecting pregnancy, antepartum 03/01/2015  . Previous preterm delivery in first trimester, antepartum 02/01/2015  . History of gestational hypertension 02/01/2015  . Chronic headaches 05/18/2012   She plans to breastfeed She desires condoms for postpartum contraception.   Prenatal labs and studies: ABO, Rh: --/--/O POS (06/23 1035) Antibody: NEG (06/23 1035) Rubella: 8.78 (11/18 1003) RPR: Non Reactive (06/23 1035)  HBsAg: NEGATIVE (11/18 1003)  HIV: NONREACTIVE (04/07 0913)  RUE:AVWUJWJXGBS:Negative (06/12 0000) 1 hr Glucola  72 Genetic screening Declined Anatomy US normal  Prenatal Transfer Tool  Maternal Diabetes: No Genetic Screening: Declined Maternal Ultrasounds/Referrals: Normal Fetal Ultrasounds or other Referrals:  None Maternal Substance Abuse:  No Significant Maternal Medications:  None Significant Maternal Lab Results: Lab values include: Group B Strep negative  Past Medical History  Diagnosis Date  . IBS (irritable bowel syndrome)   . Heart murmur     H/O as a child    Past Surgical History  Procedure Laterality Date  . Wisdom tooth extraction    . Cesarean section N/A 07/18/2013    Procedure: CESAREAN SECTION;  Surgeon: Adam PhenixJames G [name], MD;  Location: [facility];  Service: Obstetrics;  Laterality: N/A;    OB History  Gravida Para Term Preterm AB SAB TAB Ectopic Multiple Living  2 1 0 1 0 0 0 0 0 1     # Outcome Date GA Lbr Len/2nd Weight Sex Delivery Anes PTL Lv  2 Current           1 Preterm 07/18/13 8737w2d  6 lb 3.7 oz (2.825 kg) F  CS-LTranv Spinal  Y      Social History   Social History  . Marital Status: Married    Spouse Name: N/A  . Number of Children: N/A  . Years of Education: N/A   Occupational History  . school teacher    Social History Main Topics  . Smoking status: Never Smoker   . Smokeless tobacco: Never Used  . Alcohol Use: No  . Drug Use: No  . Sexual Activity:    Partners: Male    Birth Control/ Protection: Condom, None   Other Topics Concern  . None   Social History Narrative    Family History  Problem Relation Age of Onset  . Rheum arthritis Father   . Heart attack Father   . Lupus Father   . Cancer Father     skin  . Heart attack Mother     Prescriptions prior to admission  Medication Sig Dispense Refill Last Dose  . calcium carbonate (TUMS - DOSED IN MG ELEMENTAL CALCIUM) 500 MG chewable tablet Chew 1 tablet by mouth daily as needed for indigestion or heartburn.   Past Month at Unknown time  . Prenatal Vit-Fe Fumarate-FA (PRENATAL MULTIVITAMIN) TABS tablet Take 1 tablet by mouth daily at 12 noon.   09/08/2015 at Unknown time  . Vitamin D, Cholecalciferol, 400 UNITS CAPS Take 1 tablet by mouth daily.    09/08/2015 at Unknown time    No Known Allergies  Review of Systems: Negative except for what is  mentioned in HPI.  Physical Exam: BP 143/89 mmHg  Pulse 75  Temp(Src) 98.2 F (36.8 C) (Oral)  Resp 16  SpO2 100%  LMP 12/10/2014 FHR by Doppler: 133 bpm CONSTITUTIONAL: Well-developed, well-nourished female in no acute distress.  HENT:  Normocephalic, atraumatic, External right and left ear normal. Oropharynx is clear and moist EYES: Conjunctivae and EOM are normal. Pupils are equal, round, and reactive to light. No scleral icterus.  NECK: Normal range of motion, supple, no masses SKIN: Skin is warm and dry. No rash noted. Not diaphoretic. No erythema. No pallor. NEUROLGIC: Alert and oriented to person, place, and time. Normal reflexes, muscle tone coordination. No  cranial nerve deficit noted. PSYCHIATRIC: Normal mood and affect. Normal behavior. Normal judgment and thought content. CARDIOVASCULAR: Normal heart rate noted, regular rhythm RESPIRATORY: Effort and breath sounds normal, no problems with respiration noted ABDOMEN: Soft, nontender, nondistended, gravid. Well-healed Pfannenstiel incision. Confirmed breech presentation by US in pre-op PELVIC: Deferred MUSCULOSKELETAL: Normal range of motion. No edema and no tenderness. 2+ distal pulses.   Pertinent Labs/Studies:   No results found for this or any previous visit (from the past 72 hour(s)).  Assessment and Plan :Victoria Lin is a 30 y.o. G2P0101 at 5870w0d being admitted being admitted for scheduled cesarean section. The risks of cesarean section discussed with the patient included but were not limited to: bleeding which may require transfusion or reoperation; infection which may require antibiotics; injury to bowel, bladder, ureters or other surrounding organs; injury to the fetus; need for additional procedures including hysterectomy in the event of a life-threatening hemorrhage; placental abnormalities wth subsequent pregnancies, incisional problems, thromboembolic phenomenon and other postoperative/anesthesia complications. The patient concurred with the proposed plan, giving informed written consent for the procedure. Patient has been NPO since last night she will remain NPO for procedure. Anesthesia and OR aware. Preoperative prophylactic antibiotics and SCDs ordered on call to the OR. To OR when ready.   Federico FlakeKimberly Niles Laneshia Pina, MD , MPH, ABFM Family Medicine, OB Fellow [facility] [place]

## 2015-09-09 NOTE — Anesthesia Preprocedure Evaluation (Signed)
Anesthesia Evaluation  Patient identified by MRN, date of birth, ID band Patient awake    Reviewed: Allergy & Precautions, NPO status , Patient's Chart, lab work & pertinent test results  History of Anesthesia Complications Negative for: history of anesthetic complications  Airway Mallampati: II  TM Distance: >3 FB Neck ROM: Full    Dental  (+) Teeth Intact   Pulmonary neg pulmonary ROS,    breath sounds clear to auscultation       Cardiovascular negative cardio ROS   Rhythm:Regular     Neuro/Psych negative neurological ROS  negative psych ROS   GI/Hepatic negative GI ROS, Neg liver ROS,   Endo/Other  negative endocrine ROS  Renal/GU negative Renal ROS     Musculoskeletal   Abdominal   Peds  Hematology negative hematology ROS (+)   Anesthesia Other Findings   Reproductive/Obstetrics (+) Pregnancy                             Anesthesia Physical Anesthesia Plan  ASA: II  Anesthesia Plan: Spinal   Post-op Pain Management:    Induction:   Airway Management Planned: Natural Airway, Nasal Cannula and Simple Face Mask  Additional Equipment: None  Intra-op Plan:   Post-operative Plan:   Informed Consent: I have reviewed the patients History and Physical, chart, labs and discussed the procedure including the risks, benefits and alternatives for the proposed anesthesia with the patient or authorized representative who has indicated his/her understanding and acceptance.   Dental advisory given  Plan Discussed with: CRNA and Surgeon  Anesthesia Plan Comments:         Anesthesia Quick Evaluation

## 2015-09-09 NOTE — Transfer of Care (Signed)
Immediate Anesthesia Transfer of Care Note  Patient: Victoria Lin  Procedure(s) Performed: Procedure(s): CESAREAN SECTION (N/A)  Patient Location: PACU  Anesthesia Type:Spinal  Level of Consciousness: awake, alert  and oriented  Airway & Oxygen Therapy: Patient Spontanous Breathing  Post-op Assessment: Report given to RN and Post -op Vital signs reviewed and stable  Post vital signs: Reviewed and stable  Last Vitals:  Filed Vitals:   09/09/15 1340  BP: 143/89  Pulse: 75  Temp: 36.8 C  Resp: 16    Last Pain: There were no vitals filed for this visit.    Patients Stated Pain Goal: 3 (09/09/15 1340)  Complications: No apparent anesthesia complications

## 2015-09-09 NOTE — Op Note (Signed)
Victoria Lin PROCEDURE DATE: 09/09/2015  PREOPERATIVE DIAGNOSES: Intrauterine pregnancy at 758w0d weeks gestation; malpresentation: Breech  POSTOPERATIVE DIAGNOSES: The same; Uterine atony, Ovarian mass s/p removal  PROCEDURE:Repeat Low Transverse Cesarean Section  SURGEON:  * Lesly DukesKelly H Leggett, MD - Primary  * Federico FlakeKimberly Niles Newton, MD - Assisting  ASSISTANT:  Mamie Leversracy Tucker RNFA   INDICATIONS: Victoria Lin is a 30 y.o. [phone] at 378w0d here for cesarean section secondary to the indications listed under preoperative diagnoses; please see preoperative note for further details.  The risks of cesarean section were discussed with the patient including but were not limited to: bleeding which may require transfusion or reoperation; infection which may require antibiotics; injury to bowel, bladder, ureters or other surrounding organs; injury to the fetus; need for additional procedures including hysterectomy in the event of a life-threatening hemorrhage; placental abnormalities wth subsequent pregnancies, incisional problems, thromboembolic phenomenon and other postoperative/anesthesia complications.   The patient concurred with the proposed plan, giving informed written consent for the procedure.    FINDINGS:  Viable female infant in breech presentation, nuchal cord x1.  Apgars 8 and 9.  Clear amniotic fluid.  Intact placenta, three vessel cord.  Thin lower uterine segment that was entered bluntly when taking down the bladder flap. Uterine atony requiring interoperative pitocin, methergine, and cytotec PO.  Normal fallopian tubes. Right over normal in appearance. Left ovary with 1 cm firm, pedunculated mass.   ANESTHESIA: Spinal INTRAVENOUS FLUIDS: 2300 ml ESTIMATED BLOOD LOSS: 700 ml URINE OUTPUT:  300 ml SPECIMENS: Placenta sent to L&D, Ovarian mass to Pathology COMPLICATIONS: None immediate  PROCEDURE IN DETAIL:  The patient preoperatively received intravenous antibiotics and had sequential  compression devices applied to her lower extremities.  She was then taken to the operating room where spinal anesthesia was administered and was found to be adequate. She was then placed in a dorsal supine position with a leftward tilt, and prepped and draped in a sterile manner.  A foley catheter was placed into her bladder and attached to constant gravity.  After an adequate timeout was performed, a Pfannenstiel skin incision was made with scalpel and carried through to the underlying layer of fascia. The fascia was incised in the midline, and this incision was extended bilaterally using the Mayo scissors.  Kocher clamps were applied to the superior aspect of the fascial incision and the underlying rectus muscles were dissected off bluntly. A similar process was carried out on the inferior aspect of the fascial incision. The rectus muscles were separated in the midline bluntly and the peritoneum was entered bluntly. Attention was turned to the lower uterine segment where a bladder flap was created. While bluntly dissecting the bladder flap, the uterus was entered and then extended bluntly. The infant was successfully delivered, the cord was clamped and cut and the infant was handed over to awaiting neonatology team. Uterine massage was then administered, and the placenta delivered intact with a three-vessel cord. The uterus was then cleared of clot and debris.  The hysterotomy was closed with 0 Vicryl in a running locked fashion, and no imbricating layer was placed. Figure-of-eight serosal stitches were placed to help with hemostasis.  The pelvis was cleared of all clot and debris. Upon inspection of the ovaries we encountered a mass on the left ovary. A kelly clamp was placed across the pedunculated end and 2-0 Vicryl was used to double ligate the pedical and an additional layer of 2-0 Monocryl running stiches were placed to assure hemostasis.  Hemostasis was confirmed on all surfaces.  Intercede was placed over  the ovary and cesarean section scar.  The peritoneum and the muscles were reapproximated using 0 Vicryl interrupted stitches. The fascia was then closed using 0 Vicryl in a running fashion.  The subcutaneous layer was irrigated.  The skin was closed with a 4-0 Vicryl subcuticular stitch. The patient tolerated the procedure well. Sponge, lap, instrument and needle counts were correct x 2.  She was taken to the recovery room in stable condition.   Federico FlakeKimberly Niles Newton, MD , MPH, ABFM Family Medicine, OB Fellow [facility] [place]

## 2015-09-09 NOTE — Brief Op Note (Signed)
09/09/2015  4:38 PM  PATIENT:  Victoria Lin  30 y.o. female  PRE-OPERATIVE DIAGNOSIS:  repeat c section for breech   POST-OPERATIVE DIAGNOSIS:  repeat c section for breech  PROCEDURE:  Procedure(s): CESAREAN SECTION (N/A)  SURGEON:  Surgeon(s) and Role:    * Lesly DukesKelly H Leggett, MD - Primary    * Federico FlakeKimberly Niles Jaquelinne Glendening, MD - Assisting  ASSISTANTS: Mamie Leversracy Tucker RNFA   ANESTHESIA:   spinal  EBL:  Total I/O In: 2300 [I.V.:2300] Out: 1000 [Urine:300; Blood:700]  BLOOD ADMINISTERED:none  DRAINS: none   LOCAL MEDICATIONS USED:  NONE  SPECIMEN:  Source of Specimen:  Ovarian Cyst, Placenta  DISPOSITION OF SPECIMEN: Ovarian Cyst to PATHOLOGY, Placenta to LD  COUNTS:  YES  TOURNIQUET:  * No tourniquets in log *  DICTATION: .Note written in EPIC  PLAN OF CARE: Admit to inpatient   PATIENT DISPOSITION:  PACU - hemodynamically stable.   Delay start of Pharmacological VTE agent (>24hrs) due to surgical blood loss or risk of bleeding: yes

## 2015-09-09 NOTE — Anesthesia Postprocedure Evaluation (Signed)
Anesthesia Post Note  Patient: Victoria Lin  Procedure(s) Performed: Procedure(s) (LRB): CESAREAN SECTION (N/A)  Patient location during evaluation: PACU Anesthesia Type: Spinal Level of consciousness: oriented and awake and alert Pain management: pain level controlled Vital Signs Assessment: post-procedure vital signs reviewed and stable Respiratory status: spontaneous breathing, respiratory function stable and patient connected to nasal cannula oxygen Cardiovascular status: blood pressure returned to baseline and stable Postop Assessment: no headache, no backache, spinal receding and patient able to bend at knees Anesthetic complications: no     Last Vitals:  Filed Vitals:   09/09/15 1815 09/09/15 1920  BP: 118/63 121/67  Pulse: 48 46  Temp: 36.6 C 37.5 C  Resp: 16 18    Last Pain:  Filed Vitals:   09/09/15 2009  PainSc: 2    Pain Goal: Patients Stated Pain Goal: 3 (09/09/15 1340)               Keniel Ralston J

## 2015-09-09 NOTE — Anesthesia Procedure Notes (Signed)
Spinal Patient location during procedure: OB Staffing Anesthesiologist: Julion Gatt Preanesthetic Checklist Completed: patient identified, surgical consent, pre-op evaluation, timeout performed, IV checked, risks and benefits discussed and monitors and equipment checked Spinal Block Patient position: sitting Prep: site prepped and draped and DuraPrep Patient monitoring: heart rate, cardiac monitor, continuous pulse ox and blood pressure Approach: midline Location: L3-4 Injection technique: single-shot Needle Needle type: Pencan  Needle gauge: 24 G Needle length: 10 cm Assessment Sensory level: T4   

## 2015-09-10 LAB — CBC
HEMATOCRIT: 32.7 % — AB (ref 36.0–46.0)
HEMOGLOBIN: 10.9 g/dL — AB (ref 12.0–15.0)
MCH: 29.7 pg (ref 26.0–34.0)
MCHC: 33.3 g/dL (ref 30.0–36.0)
MCV: 89.1 fL (ref 78.0–100.0)
Platelets: 159 10*3/uL (ref 150–400)
RBC: 3.67 MIL/uL — AB (ref 3.87–5.11)
RDW: 13.2 % (ref 11.5–15.5)
WBC: 13.8 10*3/uL — AB (ref 4.0–10.5)

## 2015-09-10 LAB — BIRTH TISSUE RECOVERY COLLECTION (PLACENTA DONATION)

## 2015-09-10 NOTE — Lactation Note (Signed)
This note was copied from a baby's chart. Lactation Consultation Note Experienced BF mom BF her now 30 yr old for 13 months w/o difficulty. Mom has everted slightly short shaft nipples. Breast filling. Hand expression 4 ml colostrum relieves fullness. Made nipple more compressible after hand expression. Noted hard knot under Lt. Nipples. Slightly tender. Could be full glands. Will report to RN. Baby not interested in BF at this time. FOB holding baby while had a huge emesis out of mouth and nose yellow orange emesis. Baby suctioned mouth and nose. Nose is now stuff sounding. Encourage mom to do STS while baby doesn't feel good. Color good, instructed parents to call for color change or excessive emesis.  Referred to Baby and Me Book in Breastfeeding section Pg. 22-23 for position options and Proper latch demonstration. Educated about newborn behavior, I&O, supply and demand. Mom encouraged to feed baby 8-12 times/24 hours and with feeding cues. Mom will use DEBP if baby isn't interested in BF. Encouraged to spoon feed colostrum a little to get interested in BF. Wait for next feeding or if baby cues before then.  [facility] brochure given w/resources, support groups and LC services. Patient Name: Victoria Lin Victoria GamblesJessica Vandiver DGUYQ'IToday's Date: 09/10/2015 Reason for consult: Initial assessment   Maternal Data Has patient been taught Hand Expression?: Yes Does the patient have breastfeeding experience prior to this delivery?: Yes  Feeding Feeding Type: Breast Fed Length of feed: 2 min  LATCH Score/Interventions Latch: Repeated attempts needed to sustain latch, nipple held in mouth throughout feeding, stimulation needed to elicit sucking reflex.  Audible Swallowing: A few with stimulation  Type of Nipple: Everted at rest and after stimulation  Comfort (Breast/Nipple): Soft / non-tender     Hold (Positioning): No assistance needed to correctly position infant at breast. Intervention(s): Skin to skin;Position  options;Support Pillows;Breastfeeding basics reviewed  LATCH Score: 8  Lactation Tools Discussed/Used Tools: Pump Breast pump type: Double-Electric Breast Pump WIC Program: No Pump Review: Setup, frequency, and cleaning Initiated by:: RN Date initiated:: 09/09/15   Consult Status Consult Status: Follow-up Date: 09/10/15 (in pm) Follow-up type: In-patient    Elsia Lasota, Diamond NickelLAURA G 09/10/2015, 5:18 AM

## 2015-09-10 NOTE — Addendum Note (Signed)
Addendum  created 09/10/15 1040 by Elbert Ewingsolleen S Vihana Kydd, CRNA   Modules edited: Clinical Notes   Clinical Notes:  File: [id number]

## 2015-09-10 NOTE — Progress Notes (Signed)
POSTPARTUM PROGRESS NOTE  Post Partum Day 1 Subjective:  Victoria Lin is a 30 y.o. [id number] 7585w0d s/p rLTCS for breech.  No acute events overnight.  Pt denies problems with ambulating, voiding or po intake.  She denies nausea or vomiting.  Pain is well controlled.  She has not had flatus. She has not had bowel movement.  Lochia Minimal.   Objective: Blood pressure 107/57, pulse 45, temperature 98.2 F (36.8 C), temperature source Oral, resp. rate 16, last menstrual period 12/10/2014, SpO2 97 %, unknown if currently breastfeeding.  Physical Exam:  General: alert, cooperative and no distress Lochia:normal flow Chest: CTAB Heart: RRR no m/r/g Abdomen: +BS, soft, nontender,  Uterine Fundus: firm, 2 below umbilicus DVT Evaluation: No calf swelling or tenderness Extremities: No edema   Recent Labs  09/10/15 0558  HGB 10.9*  HCT 32.7*    Assessment/Plan:  ASSESSMENT: Victoria Lin is a 30 y.o. [id number] 4185w0d s/p rLTCS for breech  Plan for discharge tomorrow, Breastfeeding and Contraception condoms   LOS: 1 day   Olena LeatherwoodKelly M Aguilar 09/10/2015, 7:45 AM  OB fellow attestation:  I have seen and examined this patient; I agree with above documentation in the resident's note.   Federico FlakeKimberly Niles Gilford Lardizabal, MD 7:51 PM

## 2015-09-10 NOTE — Anesthesia Postprocedure Evaluation (Signed)
Anesthesia Post Note  Patient: Victoria Lin  Procedure(s) Performed: Procedure(s) (LRB): CESAREAN SECTION (N/A)  Patient location during evaluation: Mother Baby Anesthesia Type: Spinal Level of consciousness: awake, awake and alert and oriented Pain management: pain level controlled Vital Signs Assessment: post-procedure vital signs reviewed and stable Respiratory status: spontaneous breathing, nonlabored ventilation and respiratory function stable Cardiovascular status: stable Postop Assessment: no headache, no backache, patient able to bend at knees, no signs of nausea or vomiting and adequate PO intake Anesthetic complications: no     Last Vitals:  Filed Vitals:   09/10/15 0340 09/10/15 0739  BP: 112/59 107/57  Pulse: 48 45  Temp: 37.1 C 36.8 C  Resp: 20 16    Last Pain:  Filed Vitals:   09/10/15 0740  PainSc: 2    Pain Goal: Patients Stated Pain Goal: 3 (09/09/15 1340)               Marcellene Shivley

## 2015-09-11 MED ORDER — SENNOSIDES-DOCUSATE SODIUM 8.6-50 MG PO TABS: 1.0000 | ORAL_TABLET | Freq: Every evening | ORAL | Status: DC | PRN

## 2015-09-11 MED ORDER — ACETAMINOPHEN 325 MG PO TABS: 650.0000 mg | ORAL_TABLET | ORAL | Status: AC | PRN

## 2015-09-11 MED ORDER — IBUPROFEN 600 MG PO TABS: 600.0000 mg | ORAL_TABLET | Freq: Four times a day (QID) | ORAL | Status: AC

## 2015-09-11 MED ORDER — OXYCODONE-ACETAMINOPHEN 5-325 MG PO TABS: 1.0000 | ORAL_TABLET | ORAL | Status: DC | PRN

## 2015-09-11 NOTE — Discharge Summary (Signed)
OB Discharge Summary     Patient Name: Victoria Lin DOB: 10/21/1985 MRN: [id number]  Date of admission: 09/09/2015 Delivering MD: Elsie LincolnLEGGETT, KELLY H   Date of discharge: 09/11/2015  Admitting diagnosis: repeat c section and breech  Intrauterine pregnancy: [id number]     Secondary diagnosis:  Active Problems:   Chronic headaches   Previous preterm delivery in first trimester, antepartum   History of gestational hypertension   Previous cesarean delivery affecting pregnancy, antepartum   Supervision of normal subsequent pregnancy   S/P repeat low transverse C-section   Breech presentation delivered  Additional problems: none     Discharge diagnosis: Term Pregnancy Delivered                                                                                                Post partum procedures:none  Augmentation: n/a  Complications: None  Hospital course:  Sceduled C/S   30 y.o. yo [id number] at 5274w0d was admitted to the hospital 09/09/2015 for scheduled cesarean section with the following indication:Elective Repeat.  Membrane Rupture Time/Date: 3:31 PM ,09/09/2015   Patient delivered a Viable infant.09/09/2015  Details of operation can be found in separate operative note.  Pateint had an uncomplicated postpartum course.  She is ambulating, tolerating a regular diet, passing flatus, and urinating well. Patient is discharged home in stable condition on  09/11/2015          Physical exam  Filed Vitals:   09/10/15 0739 09/10/15 1535 09/10/15 1801 09/11/15 0623  BP: 107/57 113/65 118/77 117/75  Pulse: 45 53 54 53  Temp: 98.2 F (36.8 C) 98.5 F (36.9 C) 98.5 F (36.9 C) 98.3 F (36.8 C)  TempSrc: Oral Oral Oral Oral  Resp: 16 18 14 18   SpO2: 97%      General: alert, cooperative and no distress Lochia: appropriate Uterine Fundus: firm Incision: Healing well with no significant drainage DVT Evaluation: No cords or calf tenderness. No significant calf/ankle edema. Labs: Lab  Results  Component Value Date   WBC 13.8* 09/10/2015   HGB 10.9* 09/10/2015   HCT 32.7* 09/10/2015   MCV 89.1 09/10/2015   PLT 159 09/10/2015   CMP Latest Ref Rng 05/29/2013  Glucose 70 - 99 mg/dL 87  BUN 6 - 23 mg/dL 11  Creatinine 1.470.50 - 8.291.10 mg/dL 5.620.67  Sodium 130135 - 865145 mEq/L 137  Potassium 3.5 - 5.3 mEq/L 4.0  Chloride 96 - 112 mEq/L 103  CO2 19 - 32 mEq/L 24  Calcium 8.4 - 10.5 mg/dL 8.9  Total Protein 6.0 - 8.3 g/dL 6.0  Total Bilirubin 0.2 - 1.2 mg/dL 0.3  Alkaline Phos 39 - 117 U/L 70  AST 0 - 37 U/L 24  ALT 0 - 35 U/L 35    Discharge instruction: per After Visit Summary and "Baby and Me Booklet".  After visit meds:    Medication List    STOP taking these medications        Vitamin D (Cholecalciferol) 400 units Caps      TAKE these medications        acetaminophen 325 MG  tablet  Commonly known as:  TYLENOL  Take 2 tablets (650 mg total) by mouth every 4 (four) hours as needed (for pain scale < 4).     calcium carbonate 500 MG chewable tablet  Commonly known as:  TUMS - dosed in mg elemental calcium  Chew 1 tablet by mouth daily as needed for indigestion or heartburn.     ibuprofen 600 MG tablet  Commonly known as:  ADVIL,MOTRIN  Take 1 tablet (600 mg total) by mouth every 6 (six) hours.     oxyCODONE-acetaminophen 5-325 MG tablet  Commonly known as:  PERCOCET/ROXICET  Take 1 tablet by mouth every 4 (four) hours as needed (pain scale 4-7).     prenatal multivitamin Tabs tablet  Take 1 tablet by mouth daily at 12 noon.     senna-docusate 8.6-50 MG tablet  Commonly known as:  Senokot-S  Take 1 tablet by mouth at bedtime as needed for mild constipation.        Diet: routine diet  Activity: Advance as tolerated. Pelvic rest for 6 weeks.   Outpatient follow up:6 weeks Follow up Appt:No future appointments. Follow up Visit:No Follow-up on file.  Postpartum contraception: Condoms  Newborn Data: Live born female  Birth Weight: 8 lb 7.8 oz (3850  g) APGAR: 8, 9  Baby Feeding: Breast Disposition:home with mother   09/11/2015 Victoria BilisNoah B Johnathin Vanderschaaf, MD

## 2015-09-11 NOTE — Lactation Note (Signed)
This note was copied from a baby's chart. Lactation Consultation Note  Patient Name: Victoria Lin's Date: 09/11/2015 Reason for consult: Follow-up assessment Baby at 51 hr of life and set for D/C tonight. Mom reports baby is doing well. She denies breast or nipple pain, voiced no concerns. Discussed baby behavior, feeding frequency, voids, wt loss, breast changes, and nipple care. She has a pump at home and is aware of lactation services along with support group. She will call as needed.    Maternal Data    Feeding    LATCH Score/Interventions                      Lactation Tools Discussed/Used     Consult Status Consult Status: Complete    Rulon Eisenmengerlizabeth E Kennya Schwenn 09/11/2015, 6:40 PM

## 2015-09-11 NOTE — Discharge Instructions (Signed)
Cesarean Delivery, Care After  Refer to this sheet in the next few weeks. These instructions provide you with information on caring for yourself after your procedure. Your health care provider may also give you specific instructions. Your treatment has been planned according to current medical practices, but problems sometimes occur. Call your health care provider if you have any problems or questions after you go home.  HOME CARE INSTRUCTIONS   Only take over-the-counter or prescription medications as directed by your health care provider.   Do not drink alcohol, especially if you are breastfeeding or taking medication to relieve pain.   Do not chew or smoke tobacco.   Continue to use good perineal care. Good perineal care includes:    Wiping your perineum from front to back.    Keeping your perineum clean.   Check your surgical cut (incision) daily for increased redness, drainage, swelling, or separation of skin.   Clean your incision gently with soap and water every day, and then pat it dry. If your health care provider says it is okay, leave the incision uncovered. Use a bandage (dressing) if the incision is draining fluid or appears irritated. If the adhesive strips across the incision do not fall off within 7 days, carefully peel them off.   Hug a pillow when coughing or sneezing until your incision is healed. This helps to relieve pain.   Do not use tampons or douche until your health care provider says it is okay.   Shower, wash your hair, and take tub baths as directed by your health care provider.   Wear a well-fitting bra that provides breast support.   Limit wearing support panties or control-top hose.   Drink enough fluids to keep your urine clear or pale yellow.   Eat high-fiber foods such as whole grain cereals and breads, brown rice, beans, and fresh fruits and vegetables every day. These foods may help prevent or relieve constipation.   Resume activities such as climbing stairs,  driving, lifting, exercising, or traveling as directed by your health care provider.   Talk to your health care provider about resuming sexual activities. This is dependent upon your risk of infection, your rate of healing, and your comfort and desire to resume sexual activity.   Try to have someone help you with your household activities and your newborn for at least a few days after you leave the hospital.   Rest as much as possible. Try to rest or take a nap when your newborn is sleeping.   Increase your activities gradually.   Keep all of your scheduled postpartum appointments. It is very important to keep your scheduled follow-up appointments. At these appointments, your health care provider will be checking to make sure that you are healing physically and emotionally.  SEEK MEDICAL CARE IF:    You are passing large clots from your vagina. Save any clots to show your health care provider.   You have a foul smelling discharge from your vagina.   You have trouble urinating.   You are urinating frequently.   You have pain when you urinate.   You have a change in your bowel movements.   You have increasing redness, pain, or swelling near your incision.   You have pus draining from your incision.   Your incision is separating.   You have painful, hard, or reddened breasts.   You have a severe headache.   You have blurred vision or see spots.   You feel sad   or depressed.   You have thoughts of hurting yourself or your newborn.   You have questions about your care, the care of your newborn, or medications.   You are dizzy or light-headed.   You have a rash.   You have pain, redness, or swelling at the site of the removed intravenous access (IV) tube.   You have nausea or vomiting.   You stopped breastfeeding and have not had a menstrual period within 12 weeks of stopping.   You are not breastfeeding and have not had a menstrual period within 12 weeks of delivery.   You have a fever.  SEEK  IMMEDIATE MEDICAL CARE IF:   You have persistent pain.   You have chest pain.   You have shortness of breath.   You faint.   You have leg pain.   You have stomach pain.   Your vaginal bleeding saturates 2 or more sanitary pads in 1 hour.  MAKE SURE YOU:    Understand these instructions.   Will watch your condition.   Will get help right away if you are not doing well or get worse.     This information is not intended to replace advice given to you by your health care provider. Make sure you discuss any questions you have with your health care provider.     Document Released: 11/22/2001 Document Revised: 03/23/2014 Document Reviewed: 10/28/2011  Elsevier Interactive Patient Education 2016 [employer]

## 2015-09-18 ENCOUNTER — Telehealth: Payer: Self-pay | Admitting: *Deleted

## 2015-09-18 NOTE — Telephone Encounter (Signed)
Pt notified of benign corpus luteal cyst.

## 2015-09-18 NOTE — Telephone Encounter (Signed)
-----   Message from Lesly DukesKelly H Leggett, MD sent at 09/15/2015  5:13 PM EDT ----- Ovarian cyst was a corpus luteum cyst.  No evidence of malignancy.  RN to call patient

## 2015-10-25 ENCOUNTER — Encounter: Payer: Self-pay | Admitting: Family

## 2015-10-25 ENCOUNTER — Ambulatory Visit (INDEPENDENT_AMBULATORY_CARE_PROVIDER_SITE_OTHER): Payer: BLUE CROSS/BLUE SHIELD | Admitting: Family

## 2015-10-25 VITALS — BP 125/78 | HR 66 | Ht 64.0 in | Wt 140.0 lb

## 2015-10-25 DIAGNOSIS — Z124 Encounter for screening for malignant neoplasm of cervix: Secondary | ICD-10-CM

## 2015-10-25 DIAGNOSIS — Z8751 Personal history of pre-term labor: Secondary | ICD-10-CM | POA: Insufficient documentation

## 2015-10-25 DIAGNOSIS — Z98891 History of uterine scar from previous surgery: Secondary | ICD-10-CM | POA: Insufficient documentation

## 2015-10-25 NOTE — Progress Notes (Signed)
Subjective:     Victoria Lin is a 30 y.o. female who presents for a postpartum visit. She is 6 weeks postpartum following a low cervical transverse Cesarean section. I have fully reviewed the prenatal and intrapartum course. The delivery was at 39 gestational weeks. Outcome: repeat cesarean section, low transverse incision. Anesthesia: epidural. Postpartum course has been uneventful. Baby's course has been uneventful. Baby is feeding by breast. Bleeding no bleeding. Bowel function is normal. Bladder function is normal. Patient is not sexually active. Contraception method is condoms. Postpartum depression screening negative (0).  The following portions of the patient's history were reviewed and updated as appropriate: allergies, current medications, past family history, past medical history, past social history, past surgical history and problem list.  Review of Systems Pertinent items are noted in HPI.   Objective:    BP 125/78 (BP Location: Left Arm, Patient Position: Sitting)   Pulse 66   Ht 5\' 4"  (1.626 m)   Wt 140 lb (63.5 kg)   Breastfeeding? Yes   BMI 24.03 kg/m   General:  alert, cooperative and appears stated age   Breasts:  inspection negative, no nipple discharge or bleeding, no masses or nodularity palpable  Lungs: clear to auscultation bilaterally  Heart:  regular rate and rhythm, S1, S2 normal, no murmur, click, rub or gallop  Abdomen: soft, non-tender; bowel sounds normal; no masses,  no organomegaly; incision site well healed with no signs of infection   Vulva:  normal  Vagina: normal vagina, no discharge, exudate, lesion, or erythema;  Cervix:  no cervical motion tenderness; no bleeding after pap  Corpus: normal size, contour, position, consistency, mobility, non-tender  Adnexa:  normal adnexa  Rectal Exam: Not performed.        Assessment:     Normal postpartum exam. Pap smear done at today's visit.   Plan:    1. Contraception: condoms 2. Pap smear with  cotesting 3. Follow up in: 1 year or as needed.    Eino FarberWalidah Kennith GainN Karim, CNM

## 2015-10-29 LAB — CYTOLOGY - PAP

## 2016-08-10 ENCOUNTER — Emergency Department (INDEPENDENT_AMBULATORY_CARE_PROVIDER_SITE_OTHER)
Admission: EM | Admit: 2016-08-10 | Discharge: 2016-08-10 | Disposition: A | Discharge status: Home / Self Care | Payer: BLUE CROSS/BLUE SHIELD | Source: Home / Self Care | Attending: Family Medicine | Admitting: Family Medicine

## 2016-08-10 ENCOUNTER — Encounter: Payer: Self-pay | Admitting: Emergency Medicine

## 2016-08-10 DIAGNOSIS — H938X3 Other specified disorders of ear, bilateral: Secondary | ICD-10-CM

## 2016-08-10 DIAGNOSIS — H9201 Otalgia, right ear: Secondary | ICD-10-CM

## 2016-08-10 MED ORDER — AMOXICILLIN 500 MG PO CAPS: 500.0000 mg | ORAL_CAPSULE | Freq: Three times a day (TID) | ORAL | 0 refills | Status: AC

## 2016-08-10 NOTE — ED Triage Notes (Signed)
Left ear fills full, rt ear pain x 1 day

## 2016-08-10 NOTE — ED Provider Notes (Signed)
CSN: [id number]     Arrival date & time 08/10/16  1240 History   First MD Initiated Contact with Patient 08/10/16 1251     Chief Complaint  Patient presents with  . Ear Problem   (Consider location/radiation/quality/duration/timing/severity/associated sxs/prior Treatment) HPI  Victoria Lin is a 31 y.o. female presenting to UC with c/o bilateral ear fullness that started in Left ear initially about 4-5 days ago, now starting in Right ear with mild aching pain since yesterday.  Denies fever, chills, n/v/d. Denies cough, congestion or sore throat.  No recent swimming.  She recalls getting some water in her Left ear while in the shower a few weeks ago but she cannot recall anything else that may be causing the discomfort. No hx of cerumen impaction.  Pt is breastfeeding.   Past Medical History:  Diagnosis Date  . Heart murmur    H/O as a child  . IBS (irritable bowel syndrome)    Past Surgical History:  Procedure Laterality Date  . CESAREAN SECTION N/A 07/18/2013   Procedure: CESAREAN SECTION;  Surgeon: Adam PhenixJames G [name], MD;  Location: [facility];  Service: Obstetrics;  Laterality: N/A;  . CESAREAN SECTION N/A 09/09/2015   Procedure: CESAREAN SECTION;  Surgeon: Lesly DukesKelly H Leggett, MD;  [facility];  Service: Obstetrics;  Laterality: N/A;  . WISDOM TOOTH EXTRACTION     Family History  Problem Relation Age of Onset  . Rheum arthritis Father   . Heart attack Father   . Lupus Father   . Cancer Father        skin  . Heart attack Mother    Social History  Substance Use Topics  . Smoking status: Never Smoker  . Smokeless tobacco: Never Used  . Alcohol use No   OB History    Gravida Para Term Preterm AB Living   3 2 1 1  0 2   SAB TAB Ectopic Multiple Live Births   0 0 0 0 2     Review of Systems  Constitutional: Negative for chills and fever.  HENT: Positive for ear pain. Negative for congestion, sore throat, trouble swallowing and voice change.   Respiratory: Negative  for cough and shortness of breath.   Cardiovascular: Negative for chest pain and palpitations.  Gastrointestinal: Negative for abdominal pain, diarrhea, nausea and vomiting.  Musculoskeletal: Negative for arthralgias, back pain and myalgias.  Skin: Negative for rash.  Neurological: Negative for dizziness, light-headedness and headaches.    Allergies  Patient has no known allergies.  Home Medications   Prior to Admission medications   Medication Sig Start Date End Date Taking? Authorizing Provider  acetaminophen (TYLENOL) 325 MG tablet Take 2 tablets (650 mg total) by mouth every 4 (four) hours as needed (for pain scale < 4). Patient not taking: Reported on 10/25/2015 09/11/15   Wouk, Wilfred CurtisNoah Bedford, MD  amoxicillin (AMOXIL) 500 MG capsule Take 1 capsule (500 mg total) by mouth 3 (three) times daily. 08/10/16   Junius Finner'Malley, Dorian Duval, PA-C  calcium carbonate (TUMS - DOSED IN MG ELEMENTAL CALCIUM) 500 MG chewable tablet Chew 1 tablet by mouth daily as needed for indigestion or heartburn.    [provider]  cholecalciferol (VITAMIN D) 1000 units tablet Take 1,000 Units by mouth daily.    [provider]  ibuprofen (ADVIL,MOTRIN) 600 MG tablet Take 1 tablet (600 mg total) by mouth every 6 (six) hours. Patient not taking: Reported on 10/25/2015 09/11/15   Wouk, Wilfred CurtisNoah Bedford, MD  Prenatal Vit-Fe Fumarate-FA (  PRENATAL MULTIVITAMIN) TABS tablet Take 1 tablet by mouth daily at 12 noon.    [provider]   Meds Ordered and Administered this Visit  Medications - No data to display  BP 116/81 (BP Location: Left Arm)   Pulse 67   Temp 98.6 F (37 C) (Oral)   Ht 5\' 4"  (1.626 m)   Wt 129 lb (58.5 kg)   LMP 07/25/2016 (Exact Date)   SpO2 97%   BMI 22.14 kg/m  No data found.   Physical Exam  Constitutional: She is oriented to person, place, and time. She appears well-developed and well-nourished. No distress.  HENT:  Head: Normocephalic and atraumatic.  Right Ear: Tympanic  membrane is not erythematous. A middle ear effusion is present.  Left Ear: No drainage, swelling or tenderness. Tympanic membrane is erythematous. Tympanic membrane is not bulging.  Left ear: small amount of cerumen in canal Right ear: no cerumen  Eyes: EOM are normal.  Neck: Normal range of motion.  Cardiovascular: Normal rate and regular rhythm.   Pulmonary/Chest: Effort normal. No respiratory distress. She has no wheezes. She has no rales.  Musculoskeletal: Normal range of motion.  Neurological: She is alert and oriented to person, place, and time.  Skin: Skin is warm and dry. She is not diaphoretic.  Psychiatric: She has a normal mood and affect. Her behavior is normal.  Nursing note and vitals reviewed.   Urgent Care Course     Procedures (including critical care time)  Labs Review Labs Reviewed - No data to display  Imaging Review No results found.  Tympanometry: RIGHT Ear- Positive tympanometric peak pressure. LEFT Ear- Normal   MDM   1. Ear fullness, bilateral   2. Earache on right    Left ear flushed with small amount of cerumen removed. Ear still feels "full"  Tympanometry for Right ear c/w early Right AOM Pt is currently breastfeeding. Will hold off on prednisone. Encouraged nasal sinus rinses. Prescription to hold with expiration date for amoxicillin. Pt to fill if persistent fever develops, worsening pain, or not improving in 1 week.      Junius Finner, PA-C 08/10/16 1343

## 2016-08-10 NOTE — Discharge Instructions (Signed)
° °  You may try over the counter nasal sinus rinses such as a Netti Pot or Lloyd Hugereil Med nose rinse to help reduce pressure behind your ears. You may also try an over the counter allergy medication.  If you develop worsening pain, pressure, or fever, it is recommended you start taking the antibiotic- amoxicillin for an ear infection.

## 2018-12-15 ENCOUNTER — Encounter: Payer: BLUE CROSS/BLUE SHIELD | Admitting: Obstetrics & Gynecology
